# Patient Record
Sex: Male | Born: 1985
Health system: Southern US, Community
[De-identification: ages and names within clinical notes are randomized; demographics above are authoritative.]

## PROBLEM LIST (undated history)

## (undated) DIAGNOSIS — K219 Gastro-esophageal reflux disease without esophagitis: Secondary | ICD-10-CM

## (undated) HISTORY — PX: NO PAST SURGERIES: SHX2092

---
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
## 2007-07-02 ENCOUNTER — Ambulatory Visit: Payer: Self-pay | Admitting: Internal Medicine

## 2010-05-24 ENCOUNTER — Ambulatory Visit: Payer: Self-pay | Admitting: Internal Medicine

## 2010-07-16 ENCOUNTER — Ambulatory Visit: Payer: Self-pay | Admitting: Gastroenterology

## 2010-07-19 LAB — PATHOLOGY REPORT

## 2015-01-02 LAB — BASIC METABOLIC PANEL
BUN: 11 mg/dL (ref 4–21)
CREATININE: 1.1 mg/dL (ref 0.6–1.3)
GLUCOSE: 93 mg/dL
Potassium: 4.7 mmol/L (ref 3.4–5.3)
Sodium: 138 mmol/L (ref 137–147)

## 2015-01-02 LAB — LIPID PANEL
Cholesterol: 146 mg/dL (ref 0–200)
HDL: 58 mg/dL (ref 35–70)
LDL CALC: 77 mg/dL
LDl/HDL Ratio: 1.3
Triglycerides: 57 mg/dL (ref 40–160)

## 2015-01-02 LAB — CBC AND DIFFERENTIAL
HCT: 47 % (ref 41–53)
Hemoglobin: 15.5 g/dL (ref 13.5–17.5)
NEUTROS ABS: 5 /uL
PLATELETS: 245 10*3/uL (ref 150–399)
WBC: 7.4 10*3/mL

## 2015-01-02 LAB — TSH: TSH: 1.04 u[IU]/mL (ref 0.41–5.90)

## 2015-01-02 LAB — HEPATIC FUNCTION PANEL
ALK PHOS: 70 U/L (ref 25–125)
ALT: 23 U/L (ref 10–40)
AST: 20 U/L (ref 14–40)
BILIRUBIN, TOTAL: 0.6 mg/dL

## 2015-10-22 ENCOUNTER — Encounter: Payer: Self-pay | Admitting: Family Medicine

## 2015-10-22 ENCOUNTER — Ambulatory Visit (INDEPENDENT_AMBULATORY_CARE_PROVIDER_SITE_OTHER): Payer: BLUE CROSS/BLUE SHIELD | Admitting: Family Medicine

## 2015-10-22 VITALS — BP 124/78 | HR 90 | Temp 98.0°F | Resp 16 | Wt 202.0 lb

## 2015-10-22 DIAGNOSIS — M79604 Pain in right leg: Secondary | ICD-10-CM

## 2015-10-22 DIAGNOSIS — K219 Gastro-esophageal reflux disease without esophagitis: Secondary | ICD-10-CM | POA: Insufficient documentation

## 2015-10-22 DIAGNOSIS — M7121 Synovial cyst of popliteal space [Baker], right knee: Secondary | ICD-10-CM | POA: Diagnosis not present

## 2015-10-22 DIAGNOSIS — M79641 Pain in right hand: Secondary | ICD-10-CM | POA: Diagnosis not present

## 2015-10-22 MED ORDER — NAPROXEN 500 MG PO TABS: 500.0000 mg | ORAL_TABLET | Freq: Two times a day (BID) | ORAL | Status: DC

## 2015-10-22 NOTE — Progress Notes (Signed)
Patient ID: Cody Marshall, male   DOB: 1985/10/27, 30 y.o.   MRN: [id number]   Cody Marshall  MRN: [id number] DOB: 1985/10/27  Subjective:  HPI  1. Pain of right hand The patient is a 30 year old male who presents for evaluation of his right hand pain.  He notes that he has been having pain in his right hand, mostly the thumb area, for 2-3 weeks.  There has been no known trauma to the hand and has never had any problem in the past.  2. Pain of right lower extremity The patient has also been having trouble with right leg pain.  He notes that the pain is in the knee area and goes down to the calf.  He noticed some bulging veins behind the knee and he states that when he felt the back of his knee it felt different that the left knee.  He wanted to make sure he did not have any thing like a blood clot.   Patient Active Problem List   Diagnosis Date Noted  . Acid reflux 10/22/2015    No past medical history on file.  Social History   Social History  . Marital Status: Married    Spouse Name: N/A  . Number of Children: N/A  . Years of Education: N/A   Occupational History  . Not on file.   Social History Main Topics  . Smoking status: Never Smoker   . Smokeless tobacco: Not on file  . Alcohol Use: 0.0 oz/week    0 Standard drinks or equivalent per week     Comment: 2 per week  . Drug Use: No  . Sexual Activity: Not on file   Other Topics Concern  . Not on file   Social History Narrative    Outpatient Prescriptions Prior to Visit  Medication Sig Dispense Refill  . omeprazole (PRILOSEC) 40 MG capsule Take by mouth.     No facility-administered medications prior to visit.    No Known Allergies  Review of Systems  Constitutional: Negative for fever, chills and weight loss.  Respiratory: Negative for cough, hemoptysis, sputum production, shortness of breath and wheezing.   Cardiovascular: Negative for chest pain, palpitations, orthopnea and leg swelling.    Musculoskeletal: Positive for myalgias and joint pain. Negative for back pain, falls and neck pain.  Neurological: Negative for dizziness, weakness and headaches.   Objective:  BP 124/78 mmHg  Pulse 90  Temp(Src) 98 F (36.7 C) (Oral)  Resp 16  Wt 202 lb (91.627 kg)  Physical Exam  Constitutional: He is well-developed, well-nourished, and in no distress.  HENT:  Head: Normocephalic and atraumatic.  Cardiovascular: Normal rate and regular rhythm.   Pulmonary/Chest: Effort normal.  Musculoskeletal: Normal range of motion.  Left calf measurement 16 in Right calf measurement 16 in  Neurological: He is alert.  Skin: Skin is warm and dry.  Psychiatric: Mood, memory, affect and judgment normal.    Assessment and Plan :   1. Pain of right hand Possible early arthritis/bursitis.   no x-rays yet in this 30 year old.  2. Pain of right lower extremity   3. Baker's cyst, right His most likely the swelling in the right knee. Offered orthopedic referral at any point in time. - naproxen (NAPROSYN) 500 MG tablet; Take 1 tablet (500 mg total) by mouth 2 (two) times daily with a meal.  Dispense: 30 tablet; Refill: 0  I have done the exam and reviewed the above chart and it is  accurate to the best of my knowledge.  Julieanne Manson MD [facility] 10/22/2015 1:59 PM

## 2015-11-05 ENCOUNTER — Encounter: Payer: Self-pay | Admitting: Family Medicine

## 2016-05-25 ENCOUNTER — Ambulatory Visit
Admission: RE | Admit: 2016-05-25 | Discharge: 2016-05-25 | Disposition: A | Discharge status: Home / Self Care | Payer: BLUE CROSS/BLUE SHIELD | Source: Ambulatory Visit | Attending: Family Medicine | Admitting: Family Medicine

## 2016-05-25 ENCOUNTER — Ambulatory Visit (INDEPENDENT_AMBULATORY_CARE_PROVIDER_SITE_OTHER): Payer: BLUE CROSS/BLUE SHIELD | Admitting: Family Medicine

## 2016-05-25 ENCOUNTER — Encounter: Payer: Self-pay | Admitting: Family Medicine

## 2016-05-25 VITALS — BP 118/72 | HR 84 | Temp 98.7°F | Resp 16 | Wt 190.0 lb

## 2016-05-25 DIAGNOSIS — R079 Chest pain, unspecified: Secondary | ICD-10-CM | POA: Insufficient documentation

## 2016-05-25 MED ORDER — RANITIDINE HCL 150 MG PO CAPS: 150.0000 mg | ORAL_CAPSULE | Freq: Two times a day (BID) | ORAL | 11 refills | Status: DC

## 2016-05-25 MED ORDER — ASPIRIN EC 81 MG PO TBEC: 81.0000 mg | DELAYED_RELEASE_TABLET | Freq: Every day | ORAL | 11 refills | Status: DC

## 2016-05-25 NOTE — Progress Notes (Signed)
Patient: Cody Marshall Male    DOB: [birth year]   30 y.o.   MRN: [id number] Visit Date: 05/25/2016  Today's Provider: Megan Mansichard Nikola Marone Jr, MD   Chief Complaint  Patient presents with  . Chest Pain   Subjective:    Chest Pain   This is a recurrent problem. The current episode started 1 to 4 weeks ago (2 weeks ago; has similar episode in  January 2017. Saw [facility] walk in clinic for the issue at that time.). The onset quality is gradual. The problem occurs intermittently. The problem has been gradually worsening. The pain is present in the substernal region (radiating to bilateral axilla, as well as the epigastric region). The pain is at a severity of 2/10. The pain is mild. The quality of the pain is described as burning and sharp. Pertinent negatives include no abdominal pain, back pain, claudication, cough, diaphoresis, dizziness, exertional chest pressure, fever, headaches, hemoptysis, irregular heartbeat, leg pain, lower extremity edema, malaise/fatigue, nausea, near-syncope, numbness, orthopnea, palpitations, shortness of breath, syncope, vomiting or weakness. The pain is aggravated by nothing.  His family medical history is significant for early MI (father died from MI at 28 YO).       No Known Allergies No outpatient prescriptions have been marked as taking for the 05/25/16 encounter (Office Visit) with Maple Hudsonichard L Rayaan Lorah Jr., MD.    Review of Systems  Constitutional: Negative for diaphoresis, fever and malaise/fatigue.  HENT: Negative.   Eyes: Negative.   Respiratory: Negative for cough, hemoptysis and shortness of breath.   Cardiovascular: Positive for chest pain. Negative for palpitations, orthopnea, claudication, syncope and near-syncope.  Gastrointestinal: Negative for abdominal pain, nausea and vomiting.  Musculoskeletal: Negative.  Negative for back pain.  Allergic/Immunologic: Negative.   Neurological: Negative for dizziness, weakness, numbness and headaches.    Psychiatric/Behavioral: Negative.     Social History  Substance Use Topics  . Smoking status: Never Smoker  . Smokeless tobacco: Never Used  . Alcohol use 0.0 oz/week     Comment: 2 per week   Objective:   BP 118/72 (BP Location: Right Arm, Patient Position: Sitting, Cuff Size: Large)   Pulse 84   Temp 98.7 F (37.1 C) (Oral)   Resp 16   Wt 190 lb (86.2 kg)   SpO2 97%   BMI 26.50 kg/m   Physical Exam  Constitutional: He is oriented to person, place, and time. He appears well-developed and well-nourished.  HENT:  Head: Normocephalic and atraumatic.  Right Ear: External ear normal.  Left Ear: External ear normal.  Nose: Nose normal.  Eyes: Conjunctivae are normal.  Neck: Normal range of motion. Neck supple. No thyromegaly present.  Cardiovascular: Normal rate, regular rhythm and normal heart sounds.   Pulmonary/Chest: Effort normal. No respiratory distress. He exhibits no tenderness.  Abdominal: Soft. He exhibits no distension and no mass. There is no tenderness. There is no rebound and no guarding.  Musculoskeletal: He exhibits no edema.  Chest wall nontender  Lymphadenopathy:    He has no cervical adenopathy.  Neurological: He is alert and oriented to person, place, and time.  Skin: Skin is warm and dry.  Psychiatric: He has a normal mood and affect. His behavior is normal. Judgment and thought content normal.        Assessment & Plan:     1. Chest pain, unspecified chest pain type Unclear etiology. EKG WNL. Start ASA 81 mg po qd and Zantac 150 mg po BID as below.  Order CXR and refer to cardiology.Cardiology referral is due to patient's concern with other recently died of CAD at age 72. Treat for GERD. Return to clinic 3-4 weeks - EKG 12-Lead - aspirin EC 81 MG tablet; Take 1 tablet (81 mg total) by mouth daily.  Dispense: 30 tablet; Refill: 11 - ranitidine (ZANTAC) 150 MG capsule; Take 1 capsule (150 mg total) by mouth 2 (two) times daily.  Dispense: 60 capsule;  Refill: 11 - Ambulatory referral to Cardiology - DG Chest 2 View; Future      I have done the exam and reviewed the above chart and it is accurate to the best of my knowledge.    Dorsey Authement Wendelyn Breslow, MD  [facility]

## 2016-05-26 NOTE — Progress Notes (Signed)
Advised  ED 

## 2016-06-03 DIAGNOSIS — R079 Chest pain, unspecified: Secondary | ICD-10-CM | POA: Insufficient documentation

## 2016-06-03 DIAGNOSIS — I208 Other forms of angina pectoris: Secondary | ICD-10-CM | POA: Diagnosis not present

## 2016-06-28 ENCOUNTER — Ambulatory Visit: Payer: BLUE CROSS/BLUE SHIELD | Admitting: Family Medicine

## 2016-07-04 ENCOUNTER — Ambulatory Visit: Payer: Self-pay | Admitting: Family Medicine

## 2016-07-06 ENCOUNTER — Ambulatory Visit: Payer: Self-pay | Admitting: Family Medicine

## 2016-08-03 ENCOUNTER — Ambulatory Visit (INDEPENDENT_AMBULATORY_CARE_PROVIDER_SITE_OTHER): Payer: BLUE CROSS/BLUE SHIELD | Admitting: Family Medicine

## 2016-08-03 VITALS — BP 110/72 | HR 76 | Temp 98.3°F | Resp 16 | Ht 71.0 in | Wt 193.0 lb

## 2016-08-03 DIAGNOSIS — Z0000 Encounter for general adult medical examination without abnormal findings: Secondary | ICD-10-CM

## 2016-08-03 NOTE — Progress Notes (Signed)
Patient: Cody Marshall, Male    DOB: 03/02/1986, 30 y.o.   MRN: [id number] Visit Date: 08/03/2016  Today's Provider: Wilhemena Durie, MD   Chief Complaint  Patient presents with  . Annual Exam   Subjective:  Cody Marshall is a 30 y.o. male who presents today for health maintenance and complete physical. He feels well. He reports exercising daily. He reports he is sleeping well.   Immunization History  Administered Date(s) Administered  . DTaP 01/14/1988, 03/16/1988, 05/13/1988, 02/10/1989, 12/09/1992  . Hepatitis B 08/02/1999, 09/27/1999, 02/14/2000  . HiB (PRP-OMP) 05/10/1989  . IPV 01/14/1988, 03/16/1988, 02/10/1989, 12/09/1992  . MMR 02/10/1989, 12/09/1992  . Tdap 12/31/2014     Review of Systems  Constitutional: Negative.   HENT: Negative.   Eyes: Negative.   Respiratory: Negative.   Cardiovascular: Negative.   Gastrointestinal: Negative.   Endocrine: Negative.   Genitourinary: Negative.   Musculoskeletal: Negative.   Skin: Negative.   Allergic/Immunologic: Negative.   Neurological: Negative.   Hematological: Negative.   Psychiatric/Behavioral: Negative.     Social History   Social History  . Marital status: Married    Spouse name: N/A  . Number of children: N/A  . Years of education: N/A   Occupational History  . Not on file.   Social History Main Topics  . Smoking status: Never Smoker  . Smokeless tobacco: Never Used  . Alcohol use 0.0 oz/week     Comment: 2 per week  . Drug use: No  . Sexual activity: Not on file   Other Topics Concern  . Not on file   Social History Narrative  . No narrative on file    Patient Active Problem List   Diagnosis Date Noted  . Acid reflux 10/22/2015    Past Surgical History:  Procedure Laterality Date  . NO PAST SURGERIES      His family history includes Heart disease in his maternal grandfather.    Outpatient Encounter Prescriptions as of 08/03/2016  Medication Sig  . omeprazole (PRILOSEC) 20 MG  capsule Take 20 mg by mouth daily.  . [DISCONTINUED] aspirin EC 81 MG tablet Take 1 tablet (81 mg total) by mouth daily.  . [DISCONTINUED] ranitidine (ZANTAC) 150 MG capsule Take 1 capsule (150 mg total) by mouth 2 (two) times daily.   No facility-administered encounter medications on file as of 08/03/2016.     Patient Care Team: Jerrol Banana., MD as PCP - General (Family Medicine)     Objective:   Vitals:  Vitals:   08/03/16 1345  BP: 110/72  Pulse: 76  Resp: 16  Temp: 98.3 F (36.8 C)  TempSrc: Oral  Weight: 193 lb (87.5 kg)  Height: 5' 11"  (1.803 m)    Physical Exam  Constitutional: He is oriented to person, place, and time. He appears well-developed and well-nourished.  HENT:  Head: Normocephalic and atraumatic.  Right Ear: External ear normal.  Left Ear: External ear normal.  Nose: Nose normal.  Mouth/Throat: Oropharynx is clear and moist.  Eyes: Conjunctivae and EOM are normal. Pupils are equal, round, and reactive to light.  Neck: Normal range of motion. Neck supple.  Cardiovascular: Normal rate, regular rhythm, normal heart sounds and intact distal pulses.   Pulmonary/Chest: Effort normal and breath sounds normal.  Abdominal: Soft. Bowel sounds are normal.  Genitourinary: Penis normal.  Musculoskeletal: Normal range of motion.  Neurological: He is alert and oriented to person, place, and time.  Skin: Skin is warm and dry.  Psychiatric: He has a normal mood and affect. His behavior is normal. Judgment and thought content normal.     Depression Screen No flowsheet data found.    Assessment & Plan:     Routine Health Maintenance and Physical Exam  Exercise Activities and Dietary recommendations Goals    None      Immunization History  Administered Date(s) Administered  . DTaP 01/14/1988, 03/16/1988, 05/13/1988, 02/10/1989, 12/09/1992  . Hepatitis B 08/02/1999, 09/27/1999, 02/14/2000  . HiB (PRP-OMP) 05/10/1989  . IPV 01/14/1988,  03/16/1988, 02/10/1989, 12/09/1992  . MMR 02/10/1989, 12/09/1992  . Tdap 12/31/2014    Health Maintenance  Topic Date Due  . HIV Screening  11/10/2002  . INFLUENZA VACCINE  10/21/2016 (Originally 05/17/2016)  . TETANUS/TDAP  12/30/2024      Discussed health benefits of physical activity, and encouraged him to engage in regular exercise appropriate for his age and condition.    I have done the exam and reviewed the chart and it is accurate to the best of my knowledge. Miguel Aschoff M.D. [facility]

## 2016-08-09 DIAGNOSIS — Z0000 Encounter for general adult medical examination without abnormal findings: Secondary | ICD-10-CM | POA: Diagnosis not present

## 2016-08-10 LAB — CBC WITH DIFFERENTIAL/PLATELET
Basophils Absolute: 0 10*3/uL (ref 0.0–0.2)
Basos: 0 %
EOS (ABSOLUTE): 0.1 10*3/uL (ref 0.0–0.4)
Eos: 1 %
HEMOGLOBIN: 15.1 g/dL (ref 12.6–17.7)
Hematocrit: 45.1 % (ref 37.5–51.0)
IMMATURE GRANS (ABS): 0 10*3/uL (ref 0.0–0.1)
IMMATURE GRANULOCYTES: 0 %
LYMPHS: 36 %
Lymphocytes Absolute: 1.8 10*3/uL (ref 0.7–3.1)
MCH: 30.6 pg (ref 26.6–33.0)
MCHC: 33.5 g/dL (ref 31.5–35.7)
MCV: 92 fL (ref 79–97)
MONOCYTES: 9 %
Monocytes Absolute: 0.5 10*3/uL (ref 0.1–0.9)
NEUTROS PCT: 54 %
Neutrophils Absolute: 2.6 10*3/uL (ref 1.4–7.0)
PLATELETS: 219 10*3/uL (ref 150–379)
RBC: 4.93 x10E6/uL (ref 4.14–5.80)
RDW: 13.5 % (ref 12.3–15.4)
WBC: 4.9 10*3/uL (ref 3.4–10.8)

## 2016-08-10 LAB — COMPREHENSIVE METABOLIC PANEL
A/G RATIO: 1.5 (ref 1.2–2.2)
ALBUMIN: 4.3 g/dL (ref 3.5–5.5)
ALT: 31 IU/L (ref 0–44)
AST: 20 IU/L (ref 0–40)
Alkaline Phosphatase: 71 IU/L (ref 39–117)
BUN / CREAT RATIO: 9 (ref 9–20)
BUN: 9 mg/dL (ref 6–20)
Bilirubin Total: 0.4 mg/dL (ref 0.0–1.2)
CALCIUM: 9.6 mg/dL (ref 8.7–10.2)
CO2: 26 mmol/L (ref 18–29)
Chloride: 100 mmol/L (ref 96–106)
Creatinine, Ser: 1 mg/dL (ref 0.76–1.27)
GFR, EST AFRICAN AMERICAN: 118 mL/min/{1.73_m2} (ref >59)
GFR, EST NON AFRICAN AMERICAN: 102 mL/min/{1.73_m2} (ref >59)
Globulin, Total: 2.8 g/dL (ref 1.5–4.5)
Glucose: 94 mg/dL (ref 65–99)
Potassium: 4.7 mmol/L (ref 3.5–5.2)
Sodium: 140 mmol/L (ref 134–144)
TOTAL PROTEIN: 7.1 g/dL (ref 6.0–8.5)

## 2016-08-10 LAB — LIPID PANEL WITH LDL/HDL RATIO
Cholesterol, Total: 166 mg/dL (ref 100–199)
HDL: 52 mg/dL (ref >39)
LDL CALC: 94 mg/dL (ref 0–99)
LDL/HDL RATIO: 1.8 ratio (ref 0.0–3.6)
Triglycerides: 102 mg/dL (ref 0–149)
VLDL CHOLESTEROL CAL: 20 mg/dL (ref 5–40)

## 2016-08-10 LAB — TSH: TSH: 1.31 u[IU]/mL (ref 0.450–4.500)

## 2016-08-16 ENCOUNTER — Encounter: Payer: Self-pay | Admitting: Family Medicine

## 2017-02-13 IMAGING — CR DG CHEST 2V
1 series · 2 of 2 positions shown · non-contrast
Comparison: None.

CLINICAL DATA: Chest pain for 2 month

EXAM:
CHEST  2 VIEW

[Series 1: dg chest 2 view · 0.14mm/px · 2 of 2 slices shown]
[im 1/2]
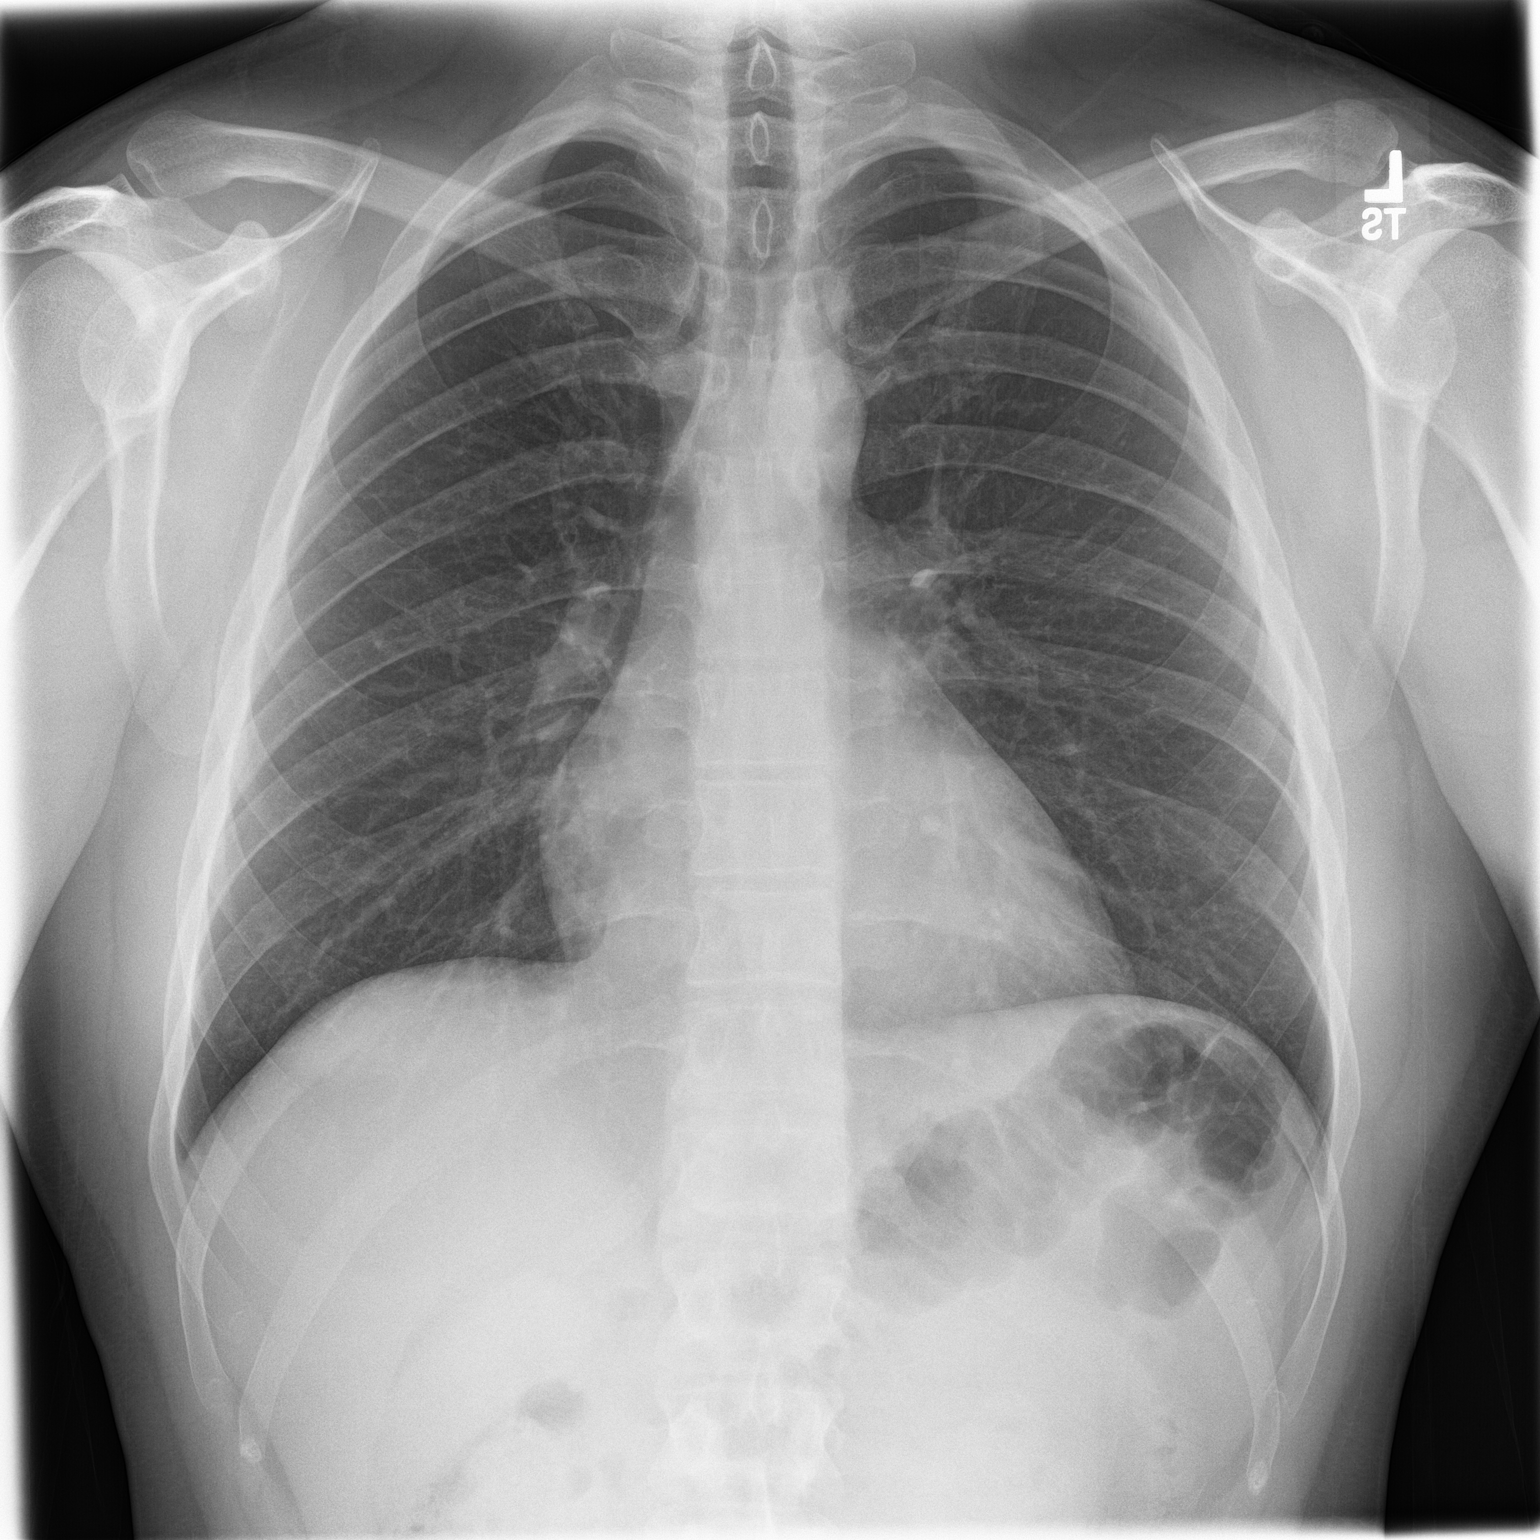
[im 2/2]
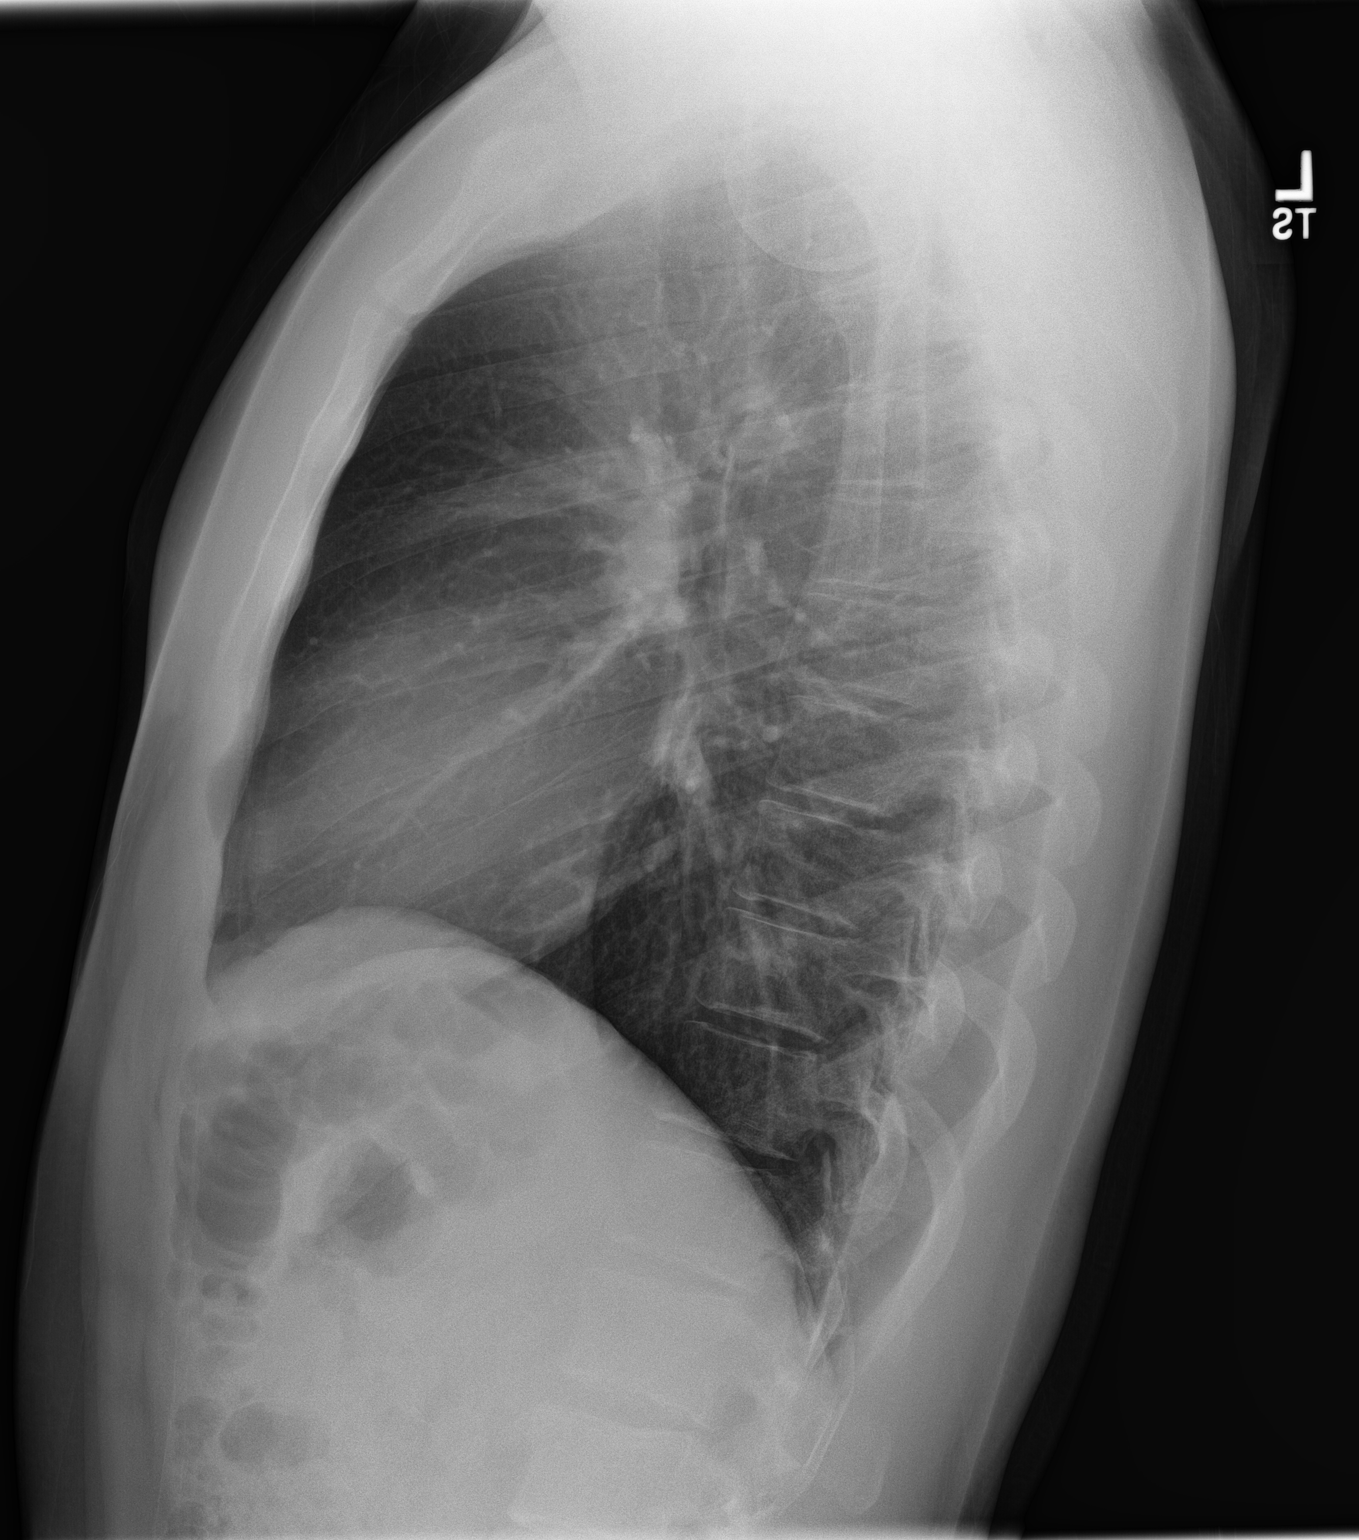

[2 of 2 positions shown; findings below may reference images not displayed]

FINDINGS: Normal heart size. Lungs clear. No pneumothorax. No pleural
effusion.
IMPRESSION: No active cardiopulmonary disease.

## 2017-05-17 ENCOUNTER — Ambulatory Visit (INDEPENDENT_AMBULATORY_CARE_PROVIDER_SITE_OTHER): Payer: BLUE CROSS/BLUE SHIELD | Admitting: Family Medicine

## 2017-05-17 VITALS — BP 122/80 | HR 64 | Temp 98.0°F | Resp 16 | Ht 71.0 in | Wt 200.0 lb

## 2017-05-17 DIAGNOSIS — M7521 Bicipital tendinitis, right shoulder: Secondary | ICD-10-CM

## 2017-05-17 MED ORDER — IBUPROFEN 600 MG PO TABS: 600.0000 mg | ORAL_TABLET | Freq: Three times a day (TID) | ORAL | 0 refills | Status: DC | PRN

## 2017-05-17 NOTE — Patient Instructions (Signed)
Immunization History  Administered Date(s) Administered  . DTaP 01/14/1988, 03/16/1988, 05/13/1988, 02/10/1989, 12/09/1992  . Hepatitis B 08/02/1999, 09/27/1999, 02/14/2000  . HiB (PRP-OMP) 05/10/1989  . IPV 01/14/1988, 03/16/1988, 02/10/1989, 12/09/1992  . MMR 02/10/1989, 12/09/1992  . Tdap 12/31/2014

## 2017-05-17 NOTE — Progress Notes (Signed)
Cody Marshall  MRN: [id number] DOB: [birth year]  Subjective:  HPI   The patient is a 31 year old male who presents for evaluation of right arm/bicep/shoulder pain.  The patient has had no known trauma to the area and has no neck pain.   He reprts that 3 weeks ago while sitting at work he noticed a sudden sharp pain in the bicep area.  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  Since that time the patient has notic ed the pain to be more of a burning sensation that radiate upward and into  His shoulder.  He states that when he massages the posterior portion of the upper arm he gets some relief in the bicep area.  Certain morvement makes the pain worse.  The patient feels that because he is using his arm differently that is what caused the pain in the shoulder.  During the time that only the bicep was hurting he was able to lift the arm but not extend the arm when in the lifted position.  At one point the area would hurt with almost any movement.  Today the pain is not quite as bad.  Ibuprofen has been helping the pain.  Patient Active Problem List   Diagnosis Date Noted  . Acid reflux 10/22/2015    No past medical history on file.  Social History   Social History  . Marital status: Married    Spouse name: N/A  . Number of children: N/A  . Years of education: N/A   Occupational History  . Not on file.   Social History Main Topics  . Smoking status: Never Smoker  . Smokeless tobacco: Never Used  . Alcohol use 0.0 oz/week     Comment: 2 per week  . Drug use: No  . Sexual activity: Not on file   Other Topics Concern  . Not on file   Social History Narrative  . No narrative on file    Outpatient Encounter Prescriptions as of 05/17/2017  Medication Sig  . omeprazole (PRILOSEC) 20 MG capsule Take 20 mg by mouth daily.   No facility-administered encounter medications on file as of 05/17/2017.     No  Known Allergies  Review of Systems  Constitutional: Negative for fever.  Respiratory: Negative for cough, shortness of breath and wheezing.   Cardiovascular: Negative for chest pain, palpitations and orthopnea.  Musculoskeletal: Positive for joint pain and myalgias. Negative for back pain and neck pain.       Range of motion and strength has been limited due to pain, both appear better today.  Neurological: Negative for tingling, tremors, sensory change and focal weakness.    Objective:  BP 122/80 (BP Location: Right Arm, Patient Position: Sitting, Cuff Size: Normal)   Pulse 64   Temp 98 F (36.7 C) (Oral)   Resp 16   Ht 5\' 11"  (1.803 m)   Wt 200 lb (90.7 kg)   BMI 27.89 kg/m   Physical Exam  Constitutional: He is well-developed, well-nourished, and in no distress.  HENT:  Head: Normocephalic and atraumatic.  Eyes: Pupils are equal, round, and reactive to light.  Neck: Normal range of motion.  Cardiovascular: Normal rate, regular rhythm, normal heart sounds and intact distal pulses.   Pulmonary/Chest: Effort normal and breath sounds normal.  Musculoskeletal: Normal range of motion. He exhibits tenderness (biceps tendon).    Assessment  and Plan :   1. Biceps tendinitis of right upper extremity Utilize Ibuprofen for 2 weeks.  If pain is persistent may need referral to Ortho.  - ibuprofen (ADVIL,MOTRIN) 600 MG tablet; Take 1 tablet (600 mg total) by mouth every 8 (eight) hours as needed.  Dispense: 30 tablet; Refill: 0  HPI, Exam and A&P Transcribed under the direction and in the presence of Cody Marshall, Jr., MD. Electronically Signed: Janey GreaserElena Marshall, Cody Marshall have done the exam and reviewed the chart and it is accurate to the best of my knowledge. [employer]ist[employer]  technology has been used and  any errors in dictation or transcription are unintentional. Cody Marshall M.D. [facility]

## 2018-01-11 ENCOUNTER — Ambulatory Visit: Payer: BLUE CROSS/BLUE SHIELD | Admitting: Family Medicine

## 2018-01-11 ENCOUNTER — Encounter: Payer: Self-pay | Admitting: Family Medicine

## 2018-01-11 VITALS — BP 130/78 | HR 63 | Temp 98.2°F | Wt 207.8 lb

## 2018-01-11 DIAGNOSIS — L2389 Allergic contact dermatitis due to other agents: Secondary | ICD-10-CM

## 2018-01-11 MED ORDER — BETAMETHASONE DIPROPIONATE 0.05 % EX CREA: TOPICAL_CREAM | Freq: Two times a day (BID) | CUTANEOUS | 0 refills | Status: DC

## 2018-01-11 NOTE — Patient Instructions (Signed)

## 2018-01-11 NOTE — Progress Notes (Signed)
       Patient: Cody RodCasey L Kittel Male    DOB: [birth year]   32 y.o.   MRN: [id number] Visit Date: 01/11/2018  Today's Provider: Dortha Kernennis Beatryce Colombo, PA   Chief Complaint  Patient presents with  . Rash   Subjective:    Rash  This is a new problem. Episode onset: Sunday. The affected locations include the right hand. The rash is characterized by blistering, itchiness and redness. He was exposed to plant contact. Treatments tried: Hydrocortisone cream. The treatment provided no relief.   No past medical history on file. Patient Active Problem List   Diagnosis Date Noted  . Chest pain, unspecified 06/03/2016  . Acid reflux 10/22/2015   Past Surgical History:  Procedure Laterality Date  . NO PAST SURGERIES     Family History  Problem Relation Age of Onset  . Heart disease Maternal Grandfather    No Known Allergies  Current Outpatient Medications:  .  ibuprofen (ADVIL,MOTRIN) 600 MG tablet, Take 1 tablet (600 mg total) by mouth every 8 (eight) hours as needed., Disp: 30 tablet, Rfl: 0 .  omeprazole (PRILOSEC) 20 MG capsule, Take 20 mg by mouth daily., Disp: , Rfl:   Review of Systems  Constitutional: Negative.   Respiratory: Negative.   Cardiovascular: Negative.   Skin: Positive for rash.   Social History   Tobacco Use  . Smoking status: Never Smoker  . Smokeless tobacco: Never Used  Substance Use Topics  . Alcohol use: Yes    Alcohol/week: 0.0 oz    Comment: 2 per week   Objective:   BP 130/78 (BP Location: Right Arm, Patient Position: Sitting, Cuff Size: Normal)   Pulse 63   Temp 98.2 F (36.8 C) (Oral)   Wt 207 lb 12.8 oz (94.3 kg)   SpO2 99%   BMI 28.98 kg/m   Physical Exam  Constitutional: He is oriented to person, place, and time. He appears well-developed and well-nourished. No distress.  HENT:  Head: Normocephalic and atraumatic.  Right Ear: Hearing normal.  Left Ear: Hearing normal.  Nose: Nose normal.  Eyes: Conjunctivae and lids are normal. Right eye  exhibits no discharge. Left eye exhibits no discharge. No scleral icterus.  Pulmonary/Chest: Effort normal. No respiratory distress.  Musculoskeletal: Normal range of motion.  Neurological: He is alert and oriented to person, place, and time.  Skin: Skin is intact. Rash noted. No lesion noted.  Pruritic vesicular rash on dorsum of the right hand with some rash between fingers.  Psychiatric: He has a normal mood and affect. His speech is normal and behavior is normal. Thought content normal.      Assessment & Plan:     1. Allergic contact dermatitis due to other agents Onset 4 days ago after cutting wood. Very itchy and blistery red rash on the back of the right hand and the webs between fingers. Treat with Diprolene cream BID and Domeboro soaks. Recheck in 5 days if needed. - betamethasone dipropionate (DIPROLENE) 0.05 % cream; Apply topically 2 (two) times daily.  Dispense: 30 g; Refill: 0       Dortha Kernennis Sumeet Geter, PA  [facility]

## 2018-01-18 ENCOUNTER — Ambulatory Visit (INDEPENDENT_AMBULATORY_CARE_PROVIDER_SITE_OTHER): Payer: BLUE CROSS/BLUE SHIELD | Admitting: Family Medicine

## 2018-01-18 VITALS — BP 118/76 | HR 60 | Temp 98.2°F | Resp 16 | Ht 71.0 in | Wt 206.0 lb

## 2018-01-18 DIAGNOSIS — Z0000 Encounter for general adult medical examination without abnormal findings: Secondary | ICD-10-CM

## 2018-01-18 LAB — POCT URINALYSIS DIPSTICK
Bilirubin, UA: NEGATIVE
GLUCOSE UA: NEGATIVE
KETONES UA: NEGATIVE
LEUKOCYTES UA: NEGATIVE
NITRITE UA: NEGATIVE
RBC UA: NEGATIVE
Urobilinogen, UA: 0.2 E.U./dL
pH, UA: 8.5 — AB (ref 5.0–8.0)

## 2018-01-18 NOTE — Progress Notes (Signed)
Patient: Cody Marshall, Male    DOB: 1985/12/14, 32 y.o.   MRN: [id number] Visit Date: 01/18/2018  Today's Provider: Wilhemena Durie, MD   Chief Complaint  Patient presents with  . Annual Exam   Subjective:  Cody Marshall is a 32 y.o. male who presents today for health maintenance and complete physical. He feels well. He reports exercising DAILY. He reports he is sleeping well.  Review of Systems  Constitutional: Negative.   HENT: Negative.   Eyes: Negative.   Respiratory: Negative.   Cardiovascular: Negative.   Gastrointestinal: Negative.   Endocrine: Negative.   Genitourinary: Negative.   Musculoskeletal: Negative.   Skin: Negative.   Allergic/Immunologic: Negative.   Neurological: Negative.   Hematological: Negative.   Psychiatric/Behavioral: Negative.     Social History   Socioeconomic History  . Marital status: Married    Spouse name: Not on file  . Number of children: Not on file  . Years of education: Not on file  . Highest education level: Not on file  Occupational History  . Not on file  Social Needs  . Financial resource strain: Not on file  . Food insecurity:    Worry: Not on file    Inability: Not on file  . Transportation needs:    Medical: Not on file    Non-medical: Not on file  Tobacco Use  . Smoking status: Never Smoker  . Smokeless tobacco: Never Used  Substance and Sexual Activity  . Alcohol use: Yes    Alcohol/week: 0.0 oz    Comment: 2 per week  . Drug use: No  . Sexual activity: Not on file  Lifestyle  . Physical activity:    Days per week: Not on file    Minutes per session: Not on file  . Stress: Not on file  Relationships  . Social connections:    Talks on phone: Not on file    Gets together: Not on file    Attends religious service: Not on file    Active member of club or organization: Not on file    Attends meetings of clubs or organizations: Not on file    Relationship status: Not on file  . Intimate partner violence:     Fear of current or ex partner: Not on file    Emotionally abused: Not on file    Physically abused: Not on file    Forced sexual activity: Not on file  Other Topics Concern  . Not on file  Social History Narrative  . Not on file    Patient Active Problem List   Diagnosis Date Noted  . Chest pain, unspecified 06/03/2016  . Acid reflux 10/22/2015    Past Surgical History:  Procedure Laterality Date  . NO PAST SURGERIES      His family history includes Heart disease in his maternal grandfather.     Outpatient Encounter Medications as of 01/18/2018  Medication Sig  . betamethasone dipropionate (DIPROLENE) 0.05 % cream Apply topically 2 (two) times daily.  Marland Kitchen ibuprofen (ADVIL,MOTRIN) 600 MG tablet Take 1 tablet (600 mg total) by mouth every 8 (eight) hours as needed.  Marland Kitchen omeprazole (PRILOSEC) 20 MG capsule Take 20 mg by mouth daily.   No facility-administered encounter medications on file as of 01/18/2018.     Patient Care Team: Jerrol Banana., MD as PCP - General (Family Medicine)      Objective:   Vitals:  Vitals:   01/18/18 0902  BP: 118/76  Pulse: 60  Resp: 16  Temp: 98.2 F (36.8 C)  TempSrc: Oral  Weight: 206 lb (93.4 kg)  Height: _0  (1.803 m)    Physical Exam  Constitutional: He is oriented to person, place, and time. He appears well-developed and well-nourished.  HENT:  Head: Normocephalic and atraumatic.  Right Ear: External ear normal.  Left Ear: External ear normal.  Nose: Nose normal.  Mouth/Throat: Oropharynx is clear and moist.  Rt tonsil 2+. Lt tonsil 1+.  Eyes: Pupils are equal, round, and reactive to light. Conjunctivae and EOM are normal.  Neck: Normal range of motion. Neck supple.  Cardiovascular: Normal rate, regular rhythm, normal heart sounds and intact distal pulses.  Pulmonary/Chest: Effort normal and breath sounds normal.  Abdominal: Soft. Bowel sounds are normal.  Genitourinary: Penis normal.  Musculoskeletal: Normal  range of motion.  Neurological: He is alert and oriented to person, place, and time.  Skin: Skin is warm and dry.  Psychiatric: He has a normal mood and affect. His behavior is normal. Judgment and thought content normal.   Fall Risk  01/18/2018  Falls in the past year? No   Home Exercise  01/18/2018  Current Exercise Habits Home exercise routine  Frequency (Times/Week) 7   Functional Status Survey: Is the patient deaf or have difficulty hearing?: No Does the patient have difficulty seeing, even when wearing glasses/contacts?: No Does the patient have difficulty concentrating, remembering, or making decisions?: No Does the patient have difficulty walking or climbing stairs?: No Does the patient have difficulty dressing or bathing?: No Does the patient have difficulty doing errands alone such as visiting a doctor's office or shopping?: No  Depression Screen PHQ 2/9 Scores 01/18/2018 05/17/2017  PHQ - 2 Score 0 0      Assessment & Plan:     Routine Health Maintenance and Physical Exam  Exercise Activities and Dietary recommendations Goals    None      Immunization History  Administered Date(s) Administered  . DTaP 01/14/1988, 03/16/1988, 05/13/1988, 02/10/1989, 12/09/1992  . Hepatitis B 08/02/1999, 09/27/1999, 02/14/2000  . HiB (PRP-OMP) 05/10/1989  . IPV 01/14/1988, 03/16/1988, 02/10/1989, 12/09/1992  . MMR 02/10/1989, 12/09/1992  . Tdap 12/31/2014    Health Maintenance  Topic Date Due  . HIV Screening  11/10/2002  . INFLUENZA VACCINE  06/16/2018 (Originally 05/17/2018)  . TETANUS/TDAP  12/30/2024     Discussed health benefits of physical activity, and encouraged him to engage in regular exercise appropriate for his age and condition.    I have done the exam and reviewed the chart and it is accurate to the best of my knowledge. Development worker, community has been used and  any errors in dictation or transcription are unintentional. Miguel Aschoff M.D. [facility]

## 2018-01-19 LAB — CBC WITH DIFFERENTIAL/PLATELET
BASOS ABS: 0 10*3/uL (ref 0.0–0.2)
Basos: 0 %
EOS (ABSOLUTE): 0 10*3/uL (ref 0.0–0.4)
Eos: 1 %
Hematocrit: 45.7 % (ref 37.5–51.0)
Hemoglobin: 15.5 g/dL (ref 13.0–17.7)
IMMATURE GRANS (ABS): 0 10*3/uL (ref 0.0–0.1)
Immature Granulocytes: 0 %
LYMPHS ABS: 2 10*3/uL (ref 0.7–3.1)
LYMPHS: 39 %
MCH: 30.6 pg (ref 26.6–33.0)
MCHC: 33.9 g/dL (ref 31.5–35.7)
MCV: 90 fL (ref 79–97)
Monocytes Absolute: 0.5 10*3/uL (ref 0.1–0.9)
Monocytes: 9 %
Neutrophils Absolute: 2.5 10*3/uL (ref 1.4–7.0)
Neutrophils: 51 %
Platelets: 253 10*3/uL (ref 150–379)
RBC: 5.06 x10E6/uL (ref 4.14–5.80)
RDW: 13.8 % (ref 12.3–15.4)
WBC: 5 10*3/uL (ref 3.4–10.8)

## 2018-01-19 LAB — COMPREHENSIVE METABOLIC PANEL
ALK PHOS: 73 IU/L (ref 39–117)
ALT: 32 IU/L (ref 0–44)
AST: 19 IU/L (ref 0–40)
Albumin/Globulin Ratio: 1.8 (ref 1.2–2.2)
Albumin: 4.6 g/dL (ref 3.5–5.5)
BUN / CREAT RATIO: 14 (ref 9–20)
BUN: 14 mg/dL (ref 6–20)
Bilirubin Total: 0.7 mg/dL (ref 0.0–1.2)
CO2: 25 mmol/L (ref 20–29)
CREATININE: 1.01 mg/dL (ref 0.76–1.27)
Calcium: 9.8 mg/dL (ref 8.7–10.2)
Chloride: 100 mmol/L (ref 96–106)
GFR calc Af Amer: 115 mL/min/{1.73_m2} (ref >59)
GFR calc non Af Amer: 99 mL/min/{1.73_m2} (ref >59)
GLUCOSE: 95 mg/dL (ref 65–99)
Globulin, Total: 2.6 g/dL (ref 1.5–4.5)
Potassium: 4.4 mmol/L (ref 3.5–5.2)
Sodium: 139 mmol/L (ref 134–144)
Total Protein: 7.2 g/dL (ref 6.0–8.5)

## 2018-01-19 LAB — LIPID PANEL WITH LDL/HDL RATIO
CHOLESTEROL TOTAL: 185 mg/dL (ref 100–199)
HDL: 52 mg/dL (ref >39)
LDL CALC: 111 mg/dL — AB (ref 0–99)
LDl/HDL Ratio: 2.1 ratio (ref 0.0–3.6)
TRIGLYCERIDES: 108 mg/dL (ref 0–149)
VLDL CHOLESTEROL CAL: 22 mg/dL (ref 5–40)

## 2018-01-19 LAB — TSH: TSH: 1.56 u[IU]/mL (ref 0.450–4.500)

## 2018-01-23 ENCOUNTER — Telehealth: Payer: Self-pay

## 2018-01-23 NOTE — Telephone Encounter (Signed)
-----   Message from Maple Hudsonichard L Gilbert Jr., MD sent at 01/22/2018  1:05 PM EDT ----- Labs OK.

## 2018-01-23 NOTE — Telephone Encounter (Signed)
Patient advised as directed below.  Thanks,  -Joseline 

## 2018-10-03 DIAGNOSIS — S9031XA Contusion of right foot, initial encounter: Secondary | ICD-10-CM | POA: Diagnosis not present

## 2018-10-03 DIAGNOSIS — M79671 Pain in right foot: Secondary | ICD-10-CM | POA: Diagnosis not present

## 2018-12-25 DIAGNOSIS — M722 Plantar fascial fibromatosis: Secondary | ICD-10-CM | POA: Diagnosis not present

## 2019-10-29 DIAGNOSIS — Z20822 Contact with and (suspected) exposure to covid-19: Secondary | ICD-10-CM | POA: Diagnosis not present

## 2020-04-15 ENCOUNTER — Emergency Department: Payer: BLUE CROSS/BLUE SHIELD

## 2020-04-15 ENCOUNTER — Other Ambulatory Visit: Payer: Self-pay

## 2020-04-15 ENCOUNTER — Emergency Department
Admission: EM | Admit: 2020-04-15 | Discharge: 2020-04-15 | Disposition: A | Discharge status: Home / Self Care | Payer: BLUE CROSS/BLUE SHIELD | Attending: Emergency Medicine | Admitting: Emergency Medicine

## 2020-04-15 ENCOUNTER — Encounter: Payer: Self-pay | Admitting: Emergency Medicine

## 2020-04-15 DIAGNOSIS — K2100 Gastro-esophageal reflux disease with esophagitis, without bleeding: Secondary | ICD-10-CM | POA: Insufficient documentation

## 2020-04-15 DIAGNOSIS — R0789 Other chest pain: Secondary | ICD-10-CM | POA: Diagnosis not present

## 2020-04-15 DIAGNOSIS — R079 Chest pain, unspecified: Secondary | ICD-10-CM | POA: Diagnosis not present

## 2020-04-15 LAB — CBC WITH DIFFERENTIAL/PLATELET
Abs Immature Granulocytes: 0.01 10*3/uL (ref 0.00–0.07)
Basophils Absolute: 0 10*3/uL (ref 0.0–0.1)
Basophils Relative: 0 %
Eosinophils Absolute: 0.1 10*3/uL (ref 0.0–0.5)
Eosinophils Relative: 1 %
HCT: 44.7 % (ref 39.0–52.0)
Hemoglobin: 15.6 g/dL (ref 13.0–17.0)
Immature Granulocytes: 0 %
Lymphocytes Relative: 34 %
Lymphs Abs: 1.6 10*3/uL (ref 0.7–4.0)
MCH: 31 pg (ref 26.0–34.0)
MCHC: 34.9 g/dL (ref 30.0–36.0)
MCV: 88.9 fL (ref 80.0–100.0)
Monocytes Absolute: 0.5 10*3/uL (ref 0.1–1.0)
Monocytes Relative: 10 %
Neutro Abs: 2.7 10*3/uL (ref 1.7–7.7)
Neutrophils Relative %: 55 %
Platelets: 243 10*3/uL (ref 150–400)
RBC: 5.03 MIL/uL (ref 4.22–5.81)
RDW: 13.5 % (ref 11.5–15.5)
WBC: 4.9 10*3/uL (ref 4.0–10.5)
nRBC: 0 % (ref 0.0–0.2)

## 2020-04-15 LAB — BASIC METABOLIC PANEL
Anion gap: 8 (ref 5–15)
BUN: 15 mg/dL (ref 6–20)
CO2: 28 mmol/L (ref 22–32)
Calcium: 9.4 mg/dL (ref 8.9–10.3)
Chloride: 102 mmol/L (ref 98–111)
Creatinine, Ser: 1.13 mg/dL (ref 0.61–1.24)
GFR calc Af Amer: >60 mL/min (ref >60)
GFR calc non Af Amer: >60 mL/min (ref >60)
Glucose, Bld: 113 mg/dL — ABNORMAL HIGH (ref 70–99)
Potassium: 3.9 mmol/L (ref 3.5–5.1)
Sodium: 138 mmol/L (ref 135–145)

## 2020-04-15 LAB — TROPONIN I (HIGH SENSITIVITY): Troponin I (High Sensitivity): 3 ng/L (ref <18)

## 2020-04-15 MED ORDER — LIDOCAINE VISCOUS HCL 2 % MT SOLN
15.0000 mL | Freq: Once | OROMUCOSAL | Status: AC
  Administered 2020-04-15: 15 mL via ORAL
  Filled 2020-04-15: qty 15

## 2020-04-15 MED ORDER — ALUM & MAG HYDROXIDE-SIMETH 200-200-20 MG/5ML PO SUSP
15.0000 mL | Freq: Once | ORAL | Status: AC
  Administered 2020-04-15: 15 mL via ORAL
  Filled 2020-04-15: qty 30

## 2020-04-15 NOTE — ED Triage Notes (Signed)
Patient reports chest pain "for a while". States last night he also started having nausea and pain in left arm.

## 2020-04-15 NOTE — ED Provider Notes (Signed)
[facility] Emergency Department Provider Note   ____________________________________________   First MD Initiated Contact with Patient 04/15/20 0801     (approximate)  I have reviewed the triage vital signs and the nursing notes.   HISTORY  Chief Complaint Chest Pain    HPI Cody Marshall is a 34 y.o. male with past medical history of GERD who presents to the ED complaining of chest pain.  Patient reports that he has had pain in his chest intermittently for about the past month.  He describes it as a central pressure that can come on at any time and is not associated with exertion.  Pain has become more frequent and constant over the past couple of days, he is now also having some nausea as well as pain moving down his left arm.  He denies any fevers, cough, shortness of breath, leg swelling or pain.  He takes omeprazole as needed for his GERD but this has not alleviated his symptoms.        History reviewed. No pertinent past medical history.  Patient Active Problem List   Diagnosis Date Noted  . Chest pain, unspecified 06/03/2016  . Acid reflux 10/22/2015    Past Surgical History:  Procedure Laterality Date  . NO PAST SURGERIES      Prior to Admission medications   Not on File    Allergies Patient has no known allergies.  Family History  Problem Relation Age of Onset  . Heart disease Maternal Grandfather     Social History Social History   Tobacco Use  . Smoking status: Never Smoker  . Smokeless tobacco: Never Used  Substance Use Topics  . Alcohol use: Yes    Alcohol/week: 0.0 standard drinks    Comment: 2 per week  . Drug use: No    Review of Systems  Constitutional: No fever/chills Eyes: No visual changes. ENT: No sore throat. Cardiovascular: Positive for chest pain. Respiratory: Denies shortness of breath. Gastrointestinal: No abdominal pain.  No nausea, no vomiting.  No diarrhea.  No constipation. Genitourinary:  Negative for dysuria. Musculoskeletal: Negative for back pain. Skin: Negative for rash. Neurological: Negative for headaches, focal weakness or numbness.  ____________________________________________   PHYSICAL EXAM:  VITAL SIGNS: ED Triage Vitals  Enc Vitals Group     BP 04/15/20 0751 (!) 138/96     Pulse Rate 04/15/20 0751 81     Resp 04/15/20 0751 16     Temp 04/15/20 0751 99 F (37.2 C)     Temp Source 04/15/20 0751 Oral     SpO2 04/15/20 0751 100 %     Weight 04/15/20 0752 200 lb (90.7 kg)     Height 04/15/20 0752 5\' 11"  (1.803 m)     Head Circumference --      Peak Flow --      Pain Score 04/15/20 0751 2     Pain Loc --      Pain Edu? --      Excl. in GC? --     Constitutional: Alert and oriented. Eyes: Conjunctivae are normal. Head: Atraumatic. Nose: No congestion/rhinnorhea. Mouth/Throat: Mucous membranes are moist. Neck: Normal ROM Cardiovascular: Normal rate, regular rhythm. Grossly normal heart sounds.  2+ radial pulses bilaterally.   Respiratory: Normal respiratory effort.  No retractions. Lungs CTAB. Gastrointestinal: Soft and nontender. No distention. Genitourinary: deferred Musculoskeletal: No lower extremity tenderness nor edema. Neurologic:  Normal speech and language. No gross focal neurologic deficits are appreciated. Skin:  Skin is  warm, dry and intact. No rash noted. Psychiatric: Mood and affect are normal. Speech and behavior are normal.  ____________________________________________   LABS (all labs ordered are listed, but only abnormal results are displayed)  Labs Reviewed  BASIC METABOLIC PANEL - Abnormal; Notable for the following components:      Result Value   Glucose, Bld 113 (*)    All other components within normal limits  CBC WITH DIFFERENTIAL/PLATELET  TROPONIN I (HIGH SENSITIVITY)   ____________________________________________  EKG  ED ECG REPORT I, Chesley Noon, the attending physician, personally viewed and  interpreted this ECG.   Date: 04/15/2020  EKG Time: 7:46  Rate: 63  Rhythm: normal EKG, normal sinus rhythm, unchanged from previous tracings  Axis: Normal  Intervals:none  ST&T Change: None   PROCEDURES  Procedure(s) performed (including Critical Care):  Procedures   ____________________________________________   INITIAL IMPRESSION / ASSESSMENT AND PLAN / ED COURSE       34 year old male with past medical history of GERD presents to the ED complaining of intermittent chest pain for the past month that has become constant over the past couple of days.  EKG shows no evidence of arrhythmia or ischemia, low suspicion for ACS given atypical symptoms but we will screen troponin.  If this is negative, patient will be appropriate for discharge home given his heart score of less than 4 and constant symptoms for the past couple of days.  I doubt PE as he is PERC negative.  Will also check basic labs and chest x-ray.  Given his history of GERD, we will also trial GI cocktail here in the ED.  Patient reports improvement in his symptoms following GI cocktail.  Work-up has been unremarkable with troponin within normal limits, chest x-ray negative for acute process.  Patient is appropriate for discharge home with PCP follow-up, was counseled to take omeprazole consistently rather than as needed.  He was also counseled to return to the ED for new worsening symptoms, patient agrees with plan.      ____________________________________________   FINAL CLINICAL IMPRESSION(S) / ED DIAGNOSES  Final diagnoses:  Nonspecific chest pain  Gastroesophageal reflux disease with esophagitis without hemorrhage     ED Discharge Orders    None       Note:  This document was prepared using Dragon voice recognition software and may include unintentional dictation errors.   Chesley Noon, MD 04/15/20 [phone]

## 2020-04-21 NOTE — Progress Notes (Signed)
Inetta Fermo Cummings,acting as a scribe for Megan Mans, MD.,have documented all relevant documentation on the behalf of Megan Mans, MD,as directed by  Megan Mans, MD while in the presence of Megan Mans, MD.  Established patient visit   Patient: Cody Marshall   DOB: 1986-02-11   34 y.o. Male  MRN: [id number] Visit Date: 04/23/2020  Today's healthcare provider: Megan Mans, MD   Chief Complaint  Patient presents with  . Anxiety   Subjective    Anxiety Presents for initial visit. Onset was 1 to 4 weeks ago. Symptoms include chest pain, decreased concentration and palpitations. Patient reports no confusion, depressed mood, dizziness, dry mouth, excessive worry, hyperventilation, insomnia, irritability, muscle tension, nausea, nervous/anxious behavior, panic, restlessness or shortness of breath. The symptoms are aggravated by work stress. The quality of sleep is good.   There are no known risk factors. There is no history of anemia, anxiety/panic attacks or bipolar disorder. Past treatments include nothing.   patient went to ER last week for chest pain and nausea and he said that heart problems were ruled out.  Patient thinks this is stress related.  He has had this on and off for several years.  It is not exertional.  It is not sore to the touch.  He occasionally has heartburn or belching.  He has had an EGD for reflux symptoms in the past.      Medications: No outpatient medications prior to visit.   No facility-administered medications prior to visit.    Review of Systems  Constitutional: Negative for irritability.  Respiratory: Negative for shortness of breath.   Cardiovascular: Positive for chest pain and palpitations.  Gastrointestinal: Negative for nausea.  Neurological: Negative for dizziness.  Psychiatric/Behavioral: Positive for decreased concentration. Negative for confusion. The patient is not nervous/anxious and does not have  insomnia.        Objective    BP 131/87 (BP Location: Right Arm, Patient Position: Sitting, Cuff Size: Large)   Pulse (!) 54   Temp (!) 97.3 F (36.3 C) (Temporal)   Ht 5\' 11"  (1.803 m)   Wt 213 lb 12.8 oz (97 kg)   BMI 29.82 kg/m     Physical Exam Vitals reviewed.  Constitutional:      Appearance: Normal appearance.  HENT:     Head: Normocephalic and atraumatic.     Right Ear: External ear normal.     Left Ear: External ear normal.  Eyes:     General: No scleral icterus.    Conjunctiva/sclera: Conjunctivae normal.  Cardiovascular:     Rate and Rhythm: Normal rate and regular rhythm.     Pulses: Normal pulses.     Heart sounds: Normal heart sounds.  Pulmonary:     Effort: Pulmonary effort is normal.     Breath sounds: Normal breath sounds.  Abdominal:     Palpations: Abdomen is soft.  Musculoskeletal:     Right lower leg: No edema.     Left lower leg: No edema.     Comments: Chest wall completely nontender  Skin:    General: Skin is warm and dry.  Neurological:     General: No focal deficit present.     Mental Status: He is alert and oriented to person, place, and time.  Psychiatric:        Mood and Affect: Mood normal.        Behavior: Behavior normal.        Thought  Content: Thought content normal.        Judgment: Judgment normal.       No results found for any visits on 04/23/20.  Assessment & Plan     Problem List Items Addressed This Visit      Digestive   Acid reflux - Primary    Increased omeprazole 20mg  to twice a day Referred to GI for Endoscopy Follow up in 3 months       Relevant Medications   omeprazole (PRILOSEC) 20 MG capsule   Other Relevant Orders   Ambulatory referral to Gastroenterology     Problem List Items Addressed This Visit      Digestive   Acid reflux - Primary    Increased omeprazole 20mg  to twice a day Referred to GI for Endoscopy Follow up in 3 months       Relevant Medications   omeprazole (PRILOSEC) 20  MG capsule   Other Relevant Orders   Ambulatory referral to Gastroenterology     Other   Chest pain, unspecified    I certainly think the chest pain is noncardiac and is not costochondritis.  I think it is due to reflux disease. Anxiety No general treatment needed at this time.  Return in about 3 months (around 07/24/2020).      I, , MD, have reviewed all documentation for this visit. The documentation on 04/25/20 for the exam, diagnosis, procedures, and orders are all accurate and complete.    Cline Draheim Megan Mans, MD  [facility] [phone] (phone) [phone] (fax)  [facility]

## 2020-04-23 ENCOUNTER — Encounter: Payer: Self-pay | Admitting: Family Medicine

## 2020-04-23 ENCOUNTER — Other Ambulatory Visit: Payer: Self-pay

## 2020-04-23 ENCOUNTER — Ambulatory Visit (INDEPENDENT_AMBULATORY_CARE_PROVIDER_SITE_OTHER): Payer: BLUE CROSS/BLUE SHIELD | Admitting: Family Medicine

## 2020-04-23 VITALS — BP 131/87 | HR 54 | Temp 97.3°F | Ht 71.0 in | Wt 213.8 lb

## 2020-04-23 DIAGNOSIS — R0789 Other chest pain: Secondary | ICD-10-CM | POA: Diagnosis not present

## 2020-04-23 DIAGNOSIS — K219 Gastro-esophageal reflux disease without esophagitis: Secondary | ICD-10-CM

## 2020-04-23 DIAGNOSIS — F419 Anxiety disorder, unspecified: Secondary | ICD-10-CM

## 2020-04-23 MED ORDER — OMEPRAZOLE 20 MG PO CPDR: 20.0000 mg | DELAYED_RELEASE_CAPSULE | Freq: Two times a day (BID) | ORAL | 3 refills | Status: DC

## 2020-04-23 NOTE — Assessment & Plan Note (Signed)
Increased omeprazole 20mg  to twice a day Referred to GI for Endoscopy Follow up in 3 months

## 2020-04-25 ENCOUNTER — Encounter: Payer: Self-pay | Admitting: Family Medicine

## 2020-07-02 ENCOUNTER — Other Ambulatory Visit: Payer: Self-pay

## 2020-07-02 ENCOUNTER — Encounter: Payer: Self-pay | Admitting: Gastroenterology

## 2020-07-02 ENCOUNTER — Ambulatory Visit: Payer: BLUE CROSS/BLUE SHIELD | Admitting: Gastroenterology

## 2020-07-02 VITALS — BP 134/85 | HR 66 | Temp 98.4°F | Ht 71.0 in | Wt 217.4 lb

## 2020-07-02 DIAGNOSIS — K31A0 Gastric intestinal metaplasia, unspecified: Secondary | ICD-10-CM

## 2020-07-02 DIAGNOSIS — K3189 Other diseases of stomach and duodenum: Secondary | ICD-10-CM | POA: Diagnosis not present

## 2020-07-02 DIAGNOSIS — K219 Gastro-esophageal reflux disease without esophagitis: Secondary | ICD-10-CM

## 2020-07-03 NOTE — Progress Notes (Signed)
Cody Marshall [address]  [address]  [place], [place] [address]  Main: [phone]  Fax: [phone]   Gastroenterology Consultation  Referring Provider:     Maple Hudson.,* Primary Care Physician:  Maple Hudson., MD Reason for Consultation:     Reflux        HPI:    Chief Complaint  Patient presents with  . Gastroesophageal Reflux    Cody Marshall is a 34 y.o. y/o male referred for consultation & management  by Dr. Sullivan Lone, Leonette Monarch., MD.  Patient reported atypical chest pain to primary care provider and went to ER for the same.  GI cocktail was given in the ER which helped with symptoms.  Patient was started on PPI once daily by primary care provider and this has led to improvement in symptoms.  Patient is aware of foods that can cause his symptoms to return as well and has been avoiding those.  No dysphagia.  No nausea or vomiting.  Review of previous upper endoscopy in 2011 reveals that irregular Z-line was biopsied.  Gastric biopsy showed intestinal metaplasia at that time.  No family history of GI malignancy.  History reviewed. No pertinent past medical history.  Past Surgical History:  Procedure Laterality Date  . NO PAST SURGERIES      Prior to Admission medications   Medication Sig Start Date End Date Taking? Authorizing Provider  omeprazole (PRILOSEC) 20 MG capsule Take 1 capsule (20 mg total) by mouth 2 (two) times daily. 04/23/20  Yes Maple Hudson., MD    Family History  Problem Relation Age of Onset  . Heart disease Maternal Grandfather      Social History   Tobacco Use  . Smoking status: Never Smoker  . Smokeless tobacco: Never Used  Substance Use Topics  . Alcohol use: Yes    Alcohol/week: 0.0 standard drinks    Comment: 2 per week  . Drug use: No    Allergies as of 07/02/2020  . (No Known Allergies)    Review of Systems:    All systems reviewed and negative except where noted in HPI.   Physical  Exam:  BP 134/85   Pulse 66   Temp 98.4 F (36.9 C) (Oral)   Ht 5\' 11"  (1.803 m)   Wt 217 lb 6.4 oz (98.6 kg)   BMI 30.32 kg/m  No LMP for male patient. Psych:  Alert and cooperative. Normal mood and affect. General:   Alert,  Well-developed, well-nourished, pleasant and cooperative in NAD Head:  Normocephalic and atraumatic. Eyes:  Sclera clear, no icterus.   Conjunctiva pink. Ears:  Normal auditory acuity. Nose:  No deformity, discharge, or lesions. Mouth:  No deformity or lesions,oropharynx pink & moist. Neck:  Supple; no masses or thyromegaly. Abdomen:  Normal bowel sounds.  No bruits.  Soft, non-tender and non-distended without masses, hepatosplenomegaly or hernias noted.  No guarding or rebound tenderness.    Msk:  Symmetrical without gross deformities. Good, equal movement & strength bilaterally. Pulses:  Normal pulses noted. Extremities:  No clubbing or edema.  No cyanosis. Neurologic:  Alert and oriented x3;  grossly normal neurologically. Skin:  Intact without significant lesions or rashes. No jaundice. Lymph Nodes:  No significant cervical adenopathy. Psych:  Alert and cooperative. Normal mood and affect.   Labs: CBC    Component Value Date/Time   WBC 4.9 04/15/2020 0818   RBC 5.03 04/15/2020 0818   HGB 15.6 04/15/2020  0818   HGB 15.5 01/18/2018 0937   HCT 44.7 04/15/2020 0818   HCT 45.7 01/18/2018 0937   PLT 243 04/15/2020 0818   PLT 253 01/18/2018 0937   MCV 88.9 04/15/2020 0818   MCV 90 01/18/2018 0937   MCH 31.0 04/15/2020 0818   MCHC 34.9 04/15/2020 0818   RDW 13.5 04/15/2020 0818   RDW 13.8 01/18/2018 0937   LYMPHSABS 1.6 04/15/2020 0818   LYMPHSABS 2.0 01/18/2018 0937   MONOABS 0.5 04/15/2020 0818   EOSABS 0.1 04/15/2020 0818   EOSABS 0.0 01/18/2018 0937   BASOSABS 0.0 04/15/2020 0818   BASOSABS 0.0 01/18/2018 0937   CMP     Component Value Date/Time   NA 138 04/15/2020 0818   NA 139 01/18/2018 0937   K 3.9 04/15/2020 0818   CL 102  04/15/2020 0818   CO2 28 04/15/2020 0818   GLUCOSE 113 (H) 04/15/2020 0818   BUN 15 04/15/2020 0818   BUN 14 01/18/2018 0937   CREATININE 1.13 04/15/2020 0818   CALCIUM 9.4 04/15/2020 0818   PROT 7.2 01/18/2018 0937   ALBUMIN 4.6 01/18/2018 0937   AST 19 01/18/2018 0937   ALT 32 01/18/2018 0937   ALKPHOS 73 01/18/2018 0937   BILITOT 0.7 01/18/2018 0937   GFRNONAA >60 04/15/2020 0818   GFRAA >60 04/15/2020 0818    Imaging Studies: No results found.  Assessment and Plan:   Cody Marshall is a 34 y.o. y/o male has been referred for reflux  Patient educated extensively on acid reflux lifestyle modification, including buying a bed wedge, not eating 3 hrs before bedtime, diet modifications, and handout given for the same.   Since reflux symptoms have improved, okay to discontinue PPI after 3 to 4 weeks and if symptoms return patient advised to let us know  However, given finding of intestinal metaplasia seen on 2011 biopsies, patient does need gastric mapping biopsies  I have discussed alternative options, risks & benefits,  which include, but are not limited to, bleeding, infection, perforation,respiratory complication & drug reaction.  The patient agrees with this plan & written consent will be obtained.       Dr Cody Marshall  Speech recognition software was used to dictate the above note.

## 2020-07-03 NOTE — Addendum Note (Signed)
Addended by: Melodie Bouillon on: 07/03/2020 11:37 AM   Modules accepted: Level of Service

## 2020-07-06 ENCOUNTER — Other Ambulatory Visit
Admission: RE | Admit: 2020-07-06 | Discharge: 2020-07-06 | Disposition: A | Discharge status: Home / Self Care | Payer: BLUE CROSS/BLUE SHIELD | Source: Ambulatory Visit | Attending: Gastroenterology | Admitting: Gastroenterology

## 2020-07-06 ENCOUNTER — Other Ambulatory Visit: Payer: Self-pay

## 2020-07-06 DIAGNOSIS — Z01812 Encounter for preprocedural laboratory examination: Secondary | ICD-10-CM | POA: Diagnosis not present

## 2020-07-06 DIAGNOSIS — Z20822 Contact with and (suspected) exposure to covid-19: Secondary | ICD-10-CM | POA: Insufficient documentation

## 2020-07-07 LAB — SARS CORONAVIRUS 2 (TAT 6-24 HRS): SARS Coronavirus 2: NEGATIVE

## 2020-07-08 ENCOUNTER — Other Ambulatory Visit: Payer: Self-pay

## 2020-07-08 ENCOUNTER — Ambulatory Visit: Payer: BLUE CROSS/BLUE SHIELD | Admitting: Registered Nurse

## 2020-07-08 ENCOUNTER — Encounter
Admission: RE | Disposition: A | Discharge status: Home / Self Care | Payer: Self-pay | Source: Home / Self Care | Attending: Gastroenterology

## 2020-07-08 ENCOUNTER — Ambulatory Visit
Admission: RE | Admit: 2020-07-08 | Discharge: 2020-07-08 | Disposition: A | Discharge status: Home / Self Care | Payer: BLUE CROSS/BLUE SHIELD | Attending: Gastroenterology | Admitting: Gastroenterology

## 2020-07-08 DIAGNOSIS — I890 Lymphedema, not elsewhere classified: Secondary | ICD-10-CM | POA: Diagnosis not present

## 2020-07-08 DIAGNOSIS — K3189 Other diseases of stomach and duodenum: Secondary | ICD-10-CM | POA: Diagnosis not present

## 2020-07-08 DIAGNOSIS — Z79899 Other long term (current) drug therapy: Secondary | ICD-10-CM | POA: Insufficient documentation

## 2020-07-08 DIAGNOSIS — B3781 Candidal esophagitis: Secondary | ICD-10-CM | POA: Diagnosis not present

## 2020-07-08 DIAGNOSIS — K228 Other specified diseases of esophagus: Secondary | ICD-10-CM | POA: Insufficient documentation

## 2020-07-08 DIAGNOSIS — K219 Gastro-esophageal reflux disease without esophagitis: Secondary | ICD-10-CM | POA: Insufficient documentation

## 2020-07-08 DIAGNOSIS — K31A0 Gastric intestinal metaplasia, unspecified: Secondary | ICD-10-CM

## 2020-07-08 HISTORY — PX: ESOPHAGOGASTRODUODENOSCOPY (EGD) WITH PROPOFOL: SHX5813

## 2020-07-08 LAB — KOH PREP
KOH Prep: NONE SEEN
Special Requests: NORMAL

## 2020-07-08 SURGERY — ESOPHAGOGASTRODUODENOSCOPY (EGD) WITH PROPOFOL
Anesthesia: General

## 2020-07-08 MED ORDER — FENTANYL CITRATE (PF) 100 MCG/2ML IJ SOLN
INTRAMUSCULAR | Status: DC | PRN
  Administered 2020-07-08 (×2): 25 ug via INTRAVENOUS
  Administered 2020-07-08: 50 ug via INTRAVENOUS

## 2020-07-08 MED ORDER — PROPOFOL 500 MG/50ML IV EMUL
INTRAVENOUS | Status: AC
  Filled 2020-07-08: qty 50

## 2020-07-08 MED ORDER — FENTANYL CITRATE (PF) 100 MCG/2ML IJ SOLN
INTRAMUSCULAR | Status: AC
  Filled 2020-07-08: qty 2

## 2020-07-08 MED ORDER — MIDAZOLAM HCL 2 MG/2ML IJ SOLN
INTRAMUSCULAR | Status: AC
  Filled 2020-07-08: qty 2

## 2020-07-08 MED ORDER — PROPOFOL 500 MG/50ML IV EMUL
INTRAVENOUS | Status: DC | PRN
  Administered 2020-07-08: 150 ug/kg/min via INTRAVENOUS

## 2020-07-08 MED ORDER — MIDAZOLAM HCL 2 MG/2ML IJ SOLN
INTRAMUSCULAR | Status: DC | PRN
  Administered 2020-07-08: 2 mg via INTRAVENOUS

## 2020-07-08 MED ORDER — SODIUM CHLORIDE 0.9 % IV SOLN
INTRAVENOUS | Status: DC
  Administered 2020-07-08: 20 mL/h via INTRAVENOUS

## 2020-07-08 MED ORDER — PROPOFOL 10 MG/ML IV BOLUS
INTRAVENOUS | Status: DC | PRN
  Administered 2020-07-08: 60 mg via INTRAVENOUS
  Administered 2020-07-08: 20 mg via INTRAVENOUS

## 2020-07-08 MED ORDER — LIDOCAINE HCL (CARDIAC) PF 100 MG/5ML IV SOSY
PREFILLED_SYRINGE | INTRAVENOUS | Status: DC | PRN
  Administered 2020-07-08: 100 mg via INTRAVENOUS

## 2020-07-08 NOTE — Anesthesia Postprocedure Evaluation (Signed)
Anesthesia Post Note  Patient: Cody Marshall  Procedure(s) Performed: ESOPHAGOGASTRODUODENOSCOPY (EGD) WITH PROPOFOL (N/A )  Patient location during evaluation: PACU Anesthesia Type: General Level of consciousness: awake and alert Pain management: pain level controlled Vital Signs Assessment: post-procedure vital signs reviewed and stable Respiratory status: spontaneous breathing, nonlabored ventilation and respiratory function stable Cardiovascular status: blood pressure returned to baseline and stable Postop Assessment: no apparent nausea or vomiting Anesthetic complications: no   No complications documented.   Last Vitals:  Vitals:   07/08/20 1019 07/08/20 1029  BP: (!) 112/93 116/81  Pulse: 60 (!) 57  Resp: (!) 22 16  Temp:    SpO2: 96% 98%    Last Pain:  Vitals:   07/08/20 1029  TempSrc:   PainSc: 0-No pain                 Aurelio Brash Kaitlyne Friedhoff

## 2020-07-08 NOTE — Anesthesia Preprocedure Evaluation (Addendum)
Anesthesia Evaluation  Patient identified by MRN, date of birth, ID band Patient awake    Reviewed: Allergy & Precautions, H&P , NPO status , Patient's Chart, lab work & pertinent test results  History of Anesthesia Complications Negative for: history of anesthetic complications  Airway Mallampati: II  TM Distance: >3 FB Neck ROM: full    Dental  (+) Teeth Intact   Pulmonary neg pulmonary ROS, neg sleep apnea, neg COPD,    breath sounds clear to auscultation       Cardiovascular (-) angina(-) Past MI and (-) Cardiac Stents negative cardio ROS  (-) dysrhythmias  Rhythm:regular Rate:Normal     Neuro/Psych negative neurological ROS  negative psych ROS   GI/Hepatic Neg liver ROS, GERD  ,  Endo/Other  negative endocrine ROS  Renal/GU negative Renal ROS  negative genitourinary   Musculoskeletal   Abdominal   Peds  Hematology negative hematology ROS (+)   Anesthesia Other Findings No past medical history on file.  Past Surgical History: No date: NO PAST SURGERIES  BMI    Body Mass Index: 30.27 kg/m      Reproductive/Obstetrics negative OB ROS                            Anesthesia Physical Anesthesia Plan  ASA: II  Anesthesia Plan: General   Post-op Pain Management:    Induction:   PONV Risk Score and Plan: Propofol infusion and TIVA  Airway Management Planned:   Additional Equipment:   Intra-op Plan:   Post-operative Plan:   Informed Consent: I have reviewed the patients History and Physical, chart, labs and discussed the procedure including the risks, benefits and alternatives for the proposed anesthesia with the patient or authorized representative who has indicated his/her understanding and acceptance.     Dental Advisory Given  Plan Discussed with: Anesthesiologist, CRNA and Surgeon  Anesthesia Plan Comments:         Anesthesia Quick Evaluation

## 2020-07-08 NOTE — Transfer of Care (Signed)
Immediate Anesthesia Transfer of Care Note  Patient: Cody Marshall  Procedure(s) Performed: ESOPHAGOGASTRODUODENOSCOPY (EGD) WITH PROPOFOL (N/A )  Patient Location: Endoscopy Unit  Anesthesia Type:General  Level of Consciousness: drowsy  Airway & Oxygen Therapy: Patient Spontanous Breathing  Post-op Assessment: Report given to RN and Post -op Vital signs reviewed and stable  Post vital signs: Reviewed and stable  Last Vitals:  Vitals Value Taken Time  BP 96/55 07/08/20 0959  Temp    Pulse 61 07/08/20 1000  Resp 15 07/08/20 1000  SpO2 92 % 07/08/20 1000  Vitals shown include unvalidated device data.  Last Pain:  Vitals:   07/08/20 0853  TempSrc: Temporal  PainSc: 0-No pain         Complications: No complications documented.

## 2020-07-08 NOTE — H&P (Signed)
Melodie Bouillon, MD [address], [address], [place], [place], [address] [address], [address], [place], [place], [address] Phone: [phone]  Fax: [phone]  Primary Care Physician:  Maple Hudson., MD   Pre-Procedure History & Physical: HPI:  Cody Marshall is a 34 y.o. male is here for an EGD.   No past medical history on file.  Past Surgical History:  Procedure Laterality Date  . NO PAST SURGERIES      Prior to Admission medications   Medication Sig Start Date End Date Taking? Authorizing Provider  omeprazole (PRILOSEC) 20 MG capsule Take 1 capsule (20 mg total) by mouth 2 (two) times daily. 04/23/20  Yes Maple Hudson., MD    Allergies as of 07/02/2020  . (No Known Allergies)    Family History  Problem Relation Age of Onset  . Heart disease Maternal Grandfather     Social History   Socioeconomic History  . Marital status: Married    Spouse name: Not on file  . Number of children: Not on file  . Years of education: Not on file  . Highest education level: Not on file  Occupational History  . Not on file  Tobacco Use  . Smoking status: Never Smoker  . Smokeless tobacco: Never Used  Substance and Sexual Activity  . Alcohol use: Yes    Alcohol/week: 0.0 standard drinks    Comment: 2 per week  . Drug use: No  . Sexual activity: Not on file  Other Topics Concern  . Not on file  Social History Narrative  . Not on file   Social Determinants of Health   Financial Resource Strain:   . Difficulty of Paying Living Expenses: Not on file  Food Insecurity:   . Worried About Programme researcher, broadcasting/film/video in the Last Year: Not on file  . Ran Out of Food in the Last Year: Not on file  Transportation Needs:   . Lack of Transportation (Medical): Not on file  . Lack of Transportation (Non-Medical): Not on file  Physical Activity:   . Days of Exercise per Week: Not on file  . Minutes of Exercise per Session: Not on file  Stress:   . Feeling of Stress  : Not on file  Social Connections:   . Frequency of Communication with Friends and Family: Not on file  . Frequency of Social Gatherings with Friends and Family: Not on file  . Attends Religious Services: Not on file  . Active Member of Clubs or Organizations: Not on file  . Attends Banker Meetings: Not on file  . Marital Status: Not on file  Intimate Partner Violence:   . Fear of Current or Ex-Partner: Not on file  . Emotionally Abused: Not on file  . Physically Abused: Not on file  . Sexually Abused: Not on file    Review of Systems: See HPI, otherwise negative ROS  Physical Exam: BP 122/86   Pulse 60   Temp (!) 97 F (36.1 C) (Temporal)   Resp 20   Ht 5\' 11"  (1.803 m)   Wt 98.4 kg   SpO2 100%   BMI 30.27 kg/m  General:   Alert,  pleasant and cooperative in NAD Head:  Normocephalic and atraumatic. Neck:  Supple; no masses or thyromegaly. Lungs:  Clear throughout to auscultation, normal respiratory effort.    Heart:  +S1, +S2, Regular rate and rhythm, No edema. Abdomen:  Soft, nontender and nondistended. Normal bowel sounds, without guarding,  and without rebound.   Neurologic:  Alert and  oriented x4;  grossly normal neurologically.  Impression/Plan: Cody Marshall is here for an EGD for gastric intestinal metaplasia on 2011 biopsies.  Risks, benefits, limitations, and alternatives regarding the procedure have been reviewed with the patient.  Questions have been answered.  All parties agreeable.   Pasty Spillers, MD  07/08/2020, 9:21 AM

## 2020-07-08 NOTE — Op Note (Signed)
[facility] Gastroenterology Patient Name: Cody Marshall Procedure Date: 07/08/2020 9:22 AM MRN: [id number] Account #: [id number] Date of Birth: [birth year] Admit Type: Outpatient Age: 34 Room: [facility] ENDO ROOM 2 Gender: Male Note Status: Finalized Procedure:             Upper GI endoscopy Indications:           Follow-up of intestinal metaplasia Providers:             [name] MD, MD Referring MD:          Ferdinand Lango. Sullivan Lone, MD (Referring MD) Medicines:             Monitored Anesthesia Care Complications:         No immediate complications. Procedure:             Pre-Anesthesia Assessment:                        - The risks and benefits of the procedure and the                         sedation options and risks were discussed with the                         patient. All questions were answered and informed                         consent was obtained.                        - Patient identification and proposed procedure were                         verified prior to the procedure.                        - ASA Grade Assessment: II - A patient with mild                         systemic disease.                        After obtaining informed consent, the endoscope was                         passed under direct vision. Throughout the procedure,                         the patient's blood pressure, pulse, and oxygen                         saturations were monitored continuously. The Endoscope                         was introduced through the mouth, and advanced to the                         second part of duodenum. The upper GI endoscopy was  accomplished with ease. The patient tolerated the                         procedure well. Findings:      Islands of salmon-colored mucosa were present. No other visible       abnormalities were present. Mucosa was biopsied with a cold forceps for       histology in a targeted manner and in  4 quadrants. One specimen bottle       was sent to pathology. Only 1 island of salmon colored mucosa was       present.      Patchy, white plaques were found in the proximal esophagus. Brushings       for KOH prep were obtained.      The exam of the esophagus was otherwise normal.      Patchy mildly erythematous mucosa without bleeding was found in the       gastric antrum. Biopsies were taken with a cold forceps for histology.      The exam of the stomach was otherwise normal.      The duodenal bulb was normal.      Lymphangiectasia was present in the second portion of the duodenum.      Patchy atrophic mucosa was found in the second portion of the duodenum.       Biopsies for histology were taken with a cold forceps for evaluation of       celiac disease. Impression:            - Salmon-colored mucosa suspicious for short-segment                         Barrett's esophagus. Biopsied.                        - Esophageal plaques were found, suspicious for                         candidiasis. Brushings performed.                        - Erythematous mucosa in the antrum. Biopsied.                        - Normal duodenal bulb.                        - Duodenal mucosal lymphangiectasia.                        - Duodenal mucosal atrophy. Biopsied. Recommendation:        - Await pathology results.                        - Discharge patient to home.                        - Continue present medications.                        - Resume previous diet.                        - The findings and recommendations were discussed with  the patient.                        - The findings and recommendations were discussed with                         the patient's family.                        - Return to primary care physician as previously                         scheduled.                        - Follow an antireflux regimen. Procedure Code(s):     --- Professional ---                         [phone], Esophagogastroduodenoscopy, flexible,                         transoral; with biopsy, single or multiple Diagnosis Code(s):     --- Professional ---                        K22.8, Other specified diseases of esophagus                        K22.9, Disease of esophagus, unspecified                        K31.89, Other diseases of stomach and duodenum CPT copyright 2019 American Medical Association. All rights reserved. The codes documented in this report are preliminary and upon coder review may  be revised to meet current compliance requirements.  Melodie Bouillon, MD Michel Bickers B. Maximino Greenland MD, MD 07/08/2020 10:01:42 AM This report has been signed electronically. Number of Addenda: 0 Note Initiated On: 07/08/2020 9:22 AM Estimated Blood Loss:  Estimated blood loss: none.      [facility]

## 2020-07-09 ENCOUNTER — Encounter: Payer: Self-pay | Admitting: Gastroenterology

## 2020-07-09 LAB — SURGICAL PATHOLOGY

## 2020-07-10 ENCOUNTER — Telehealth: Payer: Self-pay

## 2020-07-10 NOTE — Telephone Encounter (Signed)
-----   Message from Pasty Spillers, MD sent at 07/09/2020 12:23 PM EDT ----- Kandis Cocking please let the patient know, one of his biopsies showed intestinal metaplasia in his stomach.  This is a change in the tissue of his stomach that would need to be monitored over time.  The recommendations would be to repeat biopsies in 3 years.  This was also present on his biopsies on previous endoscopy.  Please set EGD recall for 3 years

## 2020-07-10 NOTE — Telephone Encounter (Signed)
Called patient to give him his biopsy result as below. Patient understood and had no further questions. He will be placed in our recall list.

## 2020-07-15 ENCOUNTER — Telehealth: Payer: Self-pay

## 2020-07-15 NOTE — Telephone Encounter (Signed)
Patient called stating that he still had several questions to ask the physician so he wanted to schedule an appointment. I asked him if there was any way to help him and he stated that he would prefer to speak to the physician. Therefore, I went ahead and scheduled him a telephone/virtual visit which he agreed.

## 2020-07-20 ENCOUNTER — Telehealth (INDEPENDENT_AMBULATORY_CARE_PROVIDER_SITE_OTHER): Payer: BLUE CROSS/BLUE SHIELD | Admitting: Gastroenterology

## 2020-07-20 ENCOUNTER — Encounter: Payer: Self-pay | Admitting: Gastroenterology

## 2020-07-20 DIAGNOSIS — K219 Gastro-esophageal reflux disease without esophagitis: Secondary | ICD-10-CM

## 2020-07-20 MED ORDER — FAMOTIDINE 20 MG PO TABS: 20.0000 mg | ORAL_TABLET | Freq: Every day | ORAL | 1 refills | Status: DC

## 2020-07-20 NOTE — Patient Instructions (Signed)
Please discontinue taking your Omeprazole and start taking Pepcid 20 MG daily.

## 2020-07-21 NOTE — Progress Notes (Signed)
Cody Bouillon, MD [address]  [address]  [place], [place] [address]  Main: [phone]  Fax: [phone]   Primary Care Physician: Cody Marshall., MD  Virtual Visit via Telephone Note  I connected with patient on 07/21/20 at  9:00 AM EDT by telephone and verified that I am speaking with the correct person using two identifiers.   I discussed the limitations, risks, security and privacy concerns of performing an evaluation and management service by telephone and the availability of in person appointments. I also discussed with the patient that there may be a patient responsible charge related to this service. The patient expressed understanding and agreed to proceed.  Location of Patient: Home Location of Provider: Home Persons involved: Patient and provider only during the visit (nursing staff and front desk staff was involved in communicating with the patient prior to the appointment, reviewing medications and checking them in)   History of Present Illness: Chief Complaint  Patient presents with  . Gastroesophageal Reflux    Patient stated that he continues to have GERD and have questions about it.     HPI: Cody Marshall is a 34 y.o. male here for follow-up of reflux.  Patient was asked to start Pepcid and discontinue Prilosec on last visit.  However, when he did that symptoms came back and so patient has been taking Prilosec instead and not Pepcid.  He takes it once a day.  With the symptoms are well controlled.  No dysphagia.  No nausea or vomiting.  No blood in stool.  He underwent repeat upper endoscopy for gastric intestinal metaplasia biopsies and intestinal metaplasia was seen on the lesser curvature of the gastric antrum.  Patient denies any frequent NSAID use, no smoking.  Current Outpatient Medications  Medication Sig Dispense Refill  . famotidine (PEPCID) 20 MG tablet Take 1 tablet (20 mg total) by mouth daily. 30 tablet 1  . omeprazole  (PRILOSEC) 20 MG capsule Take 1 capsule (20 mg total) by mouth 2 (two) times daily. (Patient not taking: Reported on 07/20/2020) 60 capsule 3   No current facility-administered medications for this visit.    Allergies as of 07/20/2020  . (No Known Allergies)    Review of Systems:    All systems reviewed and negative except where noted in HPI.   Observations/Objective:  Labs: CMP     Component Value Date/Time   NA 138 04/15/2020 0818   NA 139 01/18/2018 0937   K 3.9 04/15/2020 0818   CL 102 04/15/2020 0818   CO2 28 04/15/2020 0818   GLUCOSE 113 (H) 04/15/2020 0818   BUN 15 04/15/2020 0818   BUN 14 01/18/2018 0937   CREATININE 1.13 04/15/2020 0818   CALCIUM 9.4 04/15/2020 0818   PROT 7.2 01/18/2018 0937   ALBUMIN 4.6 01/18/2018 0937   AST 19 01/18/2018 0937   ALT 32 01/18/2018 0937   ALKPHOS 73 01/18/2018 0937   BILITOT 0.7 01/18/2018 0937   GFRNONAA >60 04/15/2020 0818   GFRAA >60 04/15/2020 0818   Lab Results  Component Value Date   WBC 4.9 04/15/2020   HGB 15.6 04/15/2020   HCT 44.7 04/15/2020   MCV 88.9 04/15/2020   PLT 243 04/15/2020    Imaging Studies: No results found.  Assessment and Plan:   Cody Marshall is a 34 y.o. y/o male here for follow-up of reflux and gastric intestinal metaplasia  Assessment and Plan: Reflux well controlled with once daily omeprazole Proper use 30  to 45 minutes before breakfast discussed I have asked him to discontinue the PPI after another month  (Risks of PPI use were discussed with patient including bone loss, C. Diff diarrhea, pneumonia, infections, CKD, electrolyte abnormalities.  Pt. Verbalizes understanding and chooses to continue the medication.)  Patient educated extensively on acid reflux lifestyle modification, including buying a bed wedge, not eating 3 hrs before bedtime, diet modifications, and handout given for the same.   Gastric intestinal metaplasia finding discussed and options of repeat endoscopy for  biopsies in 3 years versus, conservative management and no further biopsies discussed as well.  Patient prefers to proceed with upper endoscopy in 3 years again.  Staff asked to set recall  Patient advised to continue to avoid smoking, frequent NSAID use, and generally eat a healthy diet and avoid high salt foods   Follow Up Instructions:    I discussed the assessment and treatment plan with the patient. The patient was provided an opportunity to ask questions and all were answered. The patient agreed with the plan and demonstrated an understanding of the instructions.   The patient was advised to call back or seek an in-person evaluation if the symptoms worsen or if the condition fails to improve as anticipated.  I provided 12 minutes of non-face-to-face time during this encounter. Additional time was spent in reviewing patient's chart, placing orders etc.   Pasty Spillers, MD  Speech recognition software was used to dictate this note.

## 2020-07-27 NOTE — Progress Notes (Signed)
I,April Miller,acting as a scribe for Megan Mans, MD.,have documented all relevant documentation on the behalf of Megan Mans, MD,as directed by  Megan Mans, MD while in the presence of Megan Mans, MD.   Established patient visit   Patient: Cody Marshall   DOB: 09-14-1986   34 y.o. Male  MRN: [id number] Visit Date: 07/28/2020  Today's healthcare provider: Megan Mans, MD   Chief Complaint  Patient presents with  . Follow-up  . Gastroesophageal Reflux   Subjective    HPI   Acid Reflux From 7/08/20231-Increased omeprazole 20mg  to twice a day. Referred to GI for Endoscopy. Follow up in 3 months.      Medications: Outpatient Medications Prior to Visit  Medication Sig  . famotidine (PEPCID) 20 MG tablet Take 1 tablet (20 mg total) by mouth daily.  omeprazole (PRILOSEC) 20 MG capsule Take 1 capsule (20 mg total) by mouth 2 (two) times daily. (Patient not taking: Reported on 07/20/2020)   No facility-administered medications prior to visit.    Review of Systems  Constitutional: Negative for appetite change, chills and fever.  Respiratory: Negative for chest tightness, shortness of breath and wheezing.   Cardiovascular: Negative for chest pain and palpitations.  Gastrointestinal: Negative for abdominal pain, nausea and vomiting.       Objective    BP 121/85 (BP Location: Right Arm, Patient Position: Sitting, Cuff Size: Large)   Pulse 65   Temp 98.3 F (36.8 C) (Oral)   Resp 16   Ht 5\' 11"  (1.803 m)   Wt 217 lb (98.4 kg)   SpO2 100%   BMI 30.27 kg/m     Physical Exam Vitals reviewed.  Constitutional:      Appearance: Normal appearance.  HENT:     Head: Normocephalic and atraumatic.     Right Ear: External ear normal.     Left Ear: External ear normal.  Eyes:     General: No scleral icterus.    Conjunctiva/sclera: Conjunctivae normal.  Cardiovascular:     Rate and Rhythm: Normal rate and regular rhythm.     Pulses:  Normal pulses.     Heart sounds: Normal heart sounds.  Pulmonary:     Effort: Pulmonary effort is normal.     Breath sounds: Normal breath sounds.  Abdominal:     Palpations: Abdomen is soft.     Tenderness: There is no abdominal tenderness.  Musculoskeletal:     Right lower leg: No edema.     Left lower leg: No edema.     Comments: Chest wall completely nontender  Skin:    General: Skin is warm and dry.  Neurological:     General: No focal deficit present.     Mental Status: He is alert and oriented to person, place, and time.  Psychiatric:        Mood and Affect: Mood normal.        Behavior: Behavior normal.        Thought Content: Thought content normal.        Judgment: Judgment normal.       No results found for any visits on 07/28/20.  Assessment & Plan     1. Gastroesophageal reflux disease without esophagitis We will try to switch from PPI to famotidine.If  He has breakthrough symptoms we will go back to the PPI.   No follow-ups on file.         , MD  [place] [phone] (phone) [phone] (fax)  [place]

## 2020-07-28 ENCOUNTER — Encounter: Payer: Self-pay | Admitting: Family Medicine

## 2020-07-28 ENCOUNTER — Ambulatory Visit (INDEPENDENT_AMBULATORY_CARE_PROVIDER_SITE_OTHER): Payer: BLUE CROSS/BLUE SHIELD | Admitting: Family Medicine

## 2020-07-28 ENCOUNTER — Other Ambulatory Visit: Payer: Self-pay

## 2020-07-28 VITALS — BP 121/85 | HR 65 | Temp 98.3°F | Resp 16 | Ht 71.0 in | Wt 217.0 lb

## 2020-07-28 DIAGNOSIS — K219 Gastro-esophageal reflux disease without esophagitis: Secondary | ICD-10-CM | POA: Diagnosis not present

## 2020-08-21 NOTE — Progress Notes (Signed)
I,April Miller,acting as a scribe for Megan Mans, MD.,have documented all relevant documentation on the behalf of Megan Mans, MD,as directed by  Megan Mans, MD while in the presence of Megan Mans, MD.   Established patient visit   Patient: Cody Marshall   DOB: 1986/08/27   34 y.o. Male  MRN: [id number] Visit Date: 08/24/2020  Today's healthcare provider: Megan Mans, MD   Chief Complaint  Patient presents with  . Leg Pain   Subjective    Leg Pain  The incident occurred more than 1 week ago (2 weeks ago). There was no injury mechanism. The pain is present in the left thigh. The quality of the pain is described as shooting, stabbing, burning and aching. The pain is at a severity of 10/10. The pain is severe. The pain has been intermittent since onset. Associated symptoms include an inability to bear weight. Pertinent negatives include no loss of motion, loss of sensation, muscle weakness, numbness or tingling. He reports no foreign bodies present. He has tried NSAIDs (Advil) for the symptoms. The treatment provided no relief.    Patient states he has had pain in his left thigh for around 2 weeks. Pain is intermittent and severe at times. Patient state he did not do anything to injure himself, his leg just started hurting for no reason. Patient states pain comes on when he is sitting or lying down in the bed. Has treated symptoms with Advil with no relief. He points to discrete area in his left lower anterolateral thigh.  No aggravating or alleviating circumstances.     Medications: Outpatient Medications Prior to Visit  Medication Sig  . omeprazole (PRILOSEC) 20 MG capsule Take 1 capsule (20 mg total) by mouth 2 (two) times daily.  . famotidine (PEPCID) 20 MG tablet Take 1 tablet (20 mg total) by mouth daily. (Patient not taking: Reported on 08/24/2020)   No facility-administered medications prior to visit.    Review of Systems    Constitutional: Negative for appetite change, chills and fever.  Respiratory: Negative for chest tightness, shortness of breath and wheezing.   Cardiovascular: Negative for chest pain and palpitations.  Gastrointestinal: Negative for abdominal pain, nausea and vomiting.  Neurological: Negative for tingling and numbness.       Objective    BP 112/75 (BP Location: Right Arm, Patient Position: Sitting, Cuff Size: Large)   Pulse 71   Temp 98.4 F (36.9 C) (Oral)   Resp 16   Ht 5\' 11"  (1.803 m)   Wt 218 lb (98.9 kg)   HC 16" (40.6 cm)   SpO2 99%   BMI 30.40 kg/m     Physical Exam Vitals reviewed.  Constitutional:      Appearance: Normal appearance.  Eyes:     General: No scleral icterus. Cardiovascular:     Rate and Rhythm: Normal rate and regular rhythm.     Pulses: Normal pulses.     Heart sounds: Normal heart sounds.  Abdominal:     Palpations: Abdomen is soft.  Musculoskeletal:        General: No swelling, tenderness, deformity or signs of injury.     Comments: Normal exam of his left upper leg.  Skin:    General: Skin is warm and dry.  Neurological:     General: No focal deficit present.     Mental Status: He is alert and oriented to person, place, and time.  Psychiatric:  Mood and Affect: Mood normal.        Behavior: Behavior normal.        Thought Content: Thought content normal.        Judgment: Judgment normal.       No results found for any visits on 08/24/20.  Assessment & Plan     1. Muscle strain of left thigh, initial encounter Referral to PT at [facility].  Forward orthopedics if he does not improve. - Ambulatory referral to Physical Therapy   No follow-ups on file.         Leathia Farnell Wendelyn Breslow, MD  [facility] [phone] (phone) [phone] (fax)  [facility]

## 2020-08-24 ENCOUNTER — Ambulatory Visit (INDEPENDENT_AMBULATORY_CARE_PROVIDER_SITE_OTHER): Payer: BLUE CROSS/BLUE SHIELD | Admitting: Family Medicine

## 2020-08-24 ENCOUNTER — Other Ambulatory Visit: Payer: Self-pay

## 2020-08-24 ENCOUNTER — Encounter: Payer: Self-pay | Admitting: Family Medicine

## 2020-08-24 VITALS — BP 112/75 | HR 71 | Temp 98.4°F | Resp 16 | Ht 71.0 in | Wt 218.0 lb

## 2020-08-24 DIAGNOSIS — S76912A Strain of unspecified muscles, fascia and tendons at thigh level, left thigh, initial encounter: Secondary | ICD-10-CM | POA: Diagnosis not present

## 2020-09-23 ENCOUNTER — Encounter: Payer: Self-pay | Admitting: Family Medicine

## 2020-09-23 ENCOUNTER — Ambulatory Visit (INDEPENDENT_AMBULATORY_CARE_PROVIDER_SITE_OTHER): Payer: BLUE CROSS/BLUE SHIELD | Admitting: Family Medicine

## 2020-09-23 ENCOUNTER — Other Ambulatory Visit: Payer: Self-pay

## 2020-09-23 VITALS — BP 127/76 | HR 58 | Temp 98.3°F | Resp 16 | Ht 71.0 in | Wt 219.0 lb

## 2020-09-23 DIAGNOSIS — Z114 Encounter for screening for human immunodeficiency virus [HIV]: Secondary | ICD-10-CM

## 2020-09-23 DIAGNOSIS — Z1389 Encounter for screening for other disorder: Secondary | ICD-10-CM | POA: Diagnosis not present

## 2020-09-23 DIAGNOSIS — Z1159 Encounter for screening for other viral diseases: Secondary | ICD-10-CM | POA: Diagnosis not present

## 2020-09-23 DIAGNOSIS — K219 Gastro-esophageal reflux disease without esophagitis: Secondary | ICD-10-CM | POA: Diagnosis not present

## 2020-09-23 DIAGNOSIS — Z0000 Encounter for general adult medical examination without abnormal findings: Secondary | ICD-10-CM

## 2020-09-23 LAB — POCT URINALYSIS DIPSTICK
Bilirubin, UA: NEGATIVE
Blood, UA: NEGATIVE
Glucose, UA: NEGATIVE
Ketones, UA: NEGATIVE
Leukocytes, UA: NEGATIVE
Nitrite, UA: NEGATIVE
Protein, UA: NEGATIVE
Spec Grav, UA: 1.01 (ref 1.010–1.025)
Urobilinogen, UA: 0.2 E.U./dL
pH, UA: 7 (ref 5.0–8.0)

## 2020-09-23 NOTE — Progress Notes (Signed)
Complete physical exam   Patient: Cody Marshall   DOB: 03-29-1986   34 y.o. Male  MRN: [id number] Visit Date: 09/23/2020  Today's healthcare provider: Wilhemena Durie, MD   Chief Complaint  Patient presents with  . Annual Exam   Subjective    Cody Marshall is a 34 y.o. male who presents today for a complete physical exam.  He reports consuming a general diet. The patient has a physically strenuous job, but has no regular exercise apart from work.  He generally feels well. He reports sleeping well. He does not have additional problems to discuss today.  HPI  Needs follow-up EGD in 2024. Needs daily PPI  History reviewed. No pertinent past medical history. Past Surgical History:  Procedure Laterality Date  . ESOPHAGOGASTRODUODENOSCOPY (EGD) WITH PROPOFOL N/A 07/08/2020   Procedure: ESOPHAGOGASTRODUODENOSCOPY (EGD) WITH PROPOFOL;  Surgeon: Virgel Manifold, MD;  Location: [facility] ENDOSCOPY;  Service: Endoscopy;  Laterality: N/A;  . NO PAST SURGERIES     Social History   Socioeconomic History  . Marital status: Married    Spouse name: Not on file  . Number of children: Not on file  . Years of education: Not on file  . Highest education level: Not on file  Occupational History  . Not on file  Tobacco Use  . Smoking status: Never Smoker  . Smokeless tobacco: Never Used  Substance and Sexual Activity  . Alcohol use: Yes    Alcohol/week: 0.0 standard drinks    Comment: 2 per week  . Drug use: No  . Sexual activity: Not on file  Other Topics Concern  . Not on file  Social History Narrative  . Not on file   Social Determinants of Health   Financial Resource Strain:   . Difficulty of Paying Living Expenses: Not on file  Food Insecurity:   . Worried About Charity fundraiser in the Last Year: Not on file  . Ran Out of Food in the Last Year: Not on file  Transportation Needs:   . Lack of Transportation (Medical): Not on file  . Lack of Transportation  (Non-Medical): Not on file  Physical Activity:   . Days of Exercise per Week: Not on file  . Minutes of Exercise per Session: Not on file  Stress:   . Feeling of Stress : Not on file  Social Connections:   . Frequency of Communication with Friends and Family: Not on file  . Frequency of Social Gatherings with Friends and Family: Not on file  . Attends Religious Services: Not on file  . Active Member of Clubs or Organizations: Not on file  . Attends Archivist Meetings: Not on file  . Marital Status: Not on file  Intimate Partner Violence:   . Fear of Current or Ex-Partner: Not on file  . Emotionally Abused: Not on file  . Physically Abused: Not on file  . Sexually Abused: Not on file   Family Status  Relation Name Status  . Mother  Alive  . Father  Other       unknown health status  . Brother  Alive  . MGF  (Not Specified)   Family History  Problem Relation Age of Onset  . Heart disease Maternal Grandfather    No Known Allergies  Patient Care Team: Jerrol Banana., MD as PCP - General (Family Medicine)   Medications: Outpatient Medications Prior to Visit  Medication Sig  . omeprazole ([place]) 20  MG capsule Take 1 capsule (20 mg total) by mouth 2 (two) times daily.  . [DISCONTINUED] famotidine (PEPCID) 20 MG tablet Take 1 tablet (20 mg total) by mouth daily. (Patient not taking: Reported on 08/24/2020)   No facility-administered medications prior to visit.    Review of Systems  All other systems reviewed and are negative.   Last CBC Lab Results  Component Value Date   WBC 4.9 04/15/2020   HGB 15.6 04/15/2020   HCT 44.7 04/15/2020   MCV 88.9 04/15/2020   MCH 31.0 04/15/2020   RDW 13.5 04/15/2020   PLT 243 04/15/2020   Last metabolic panel Lab Results  Component Value Date   GLUCOSE 113 (H) 04/15/2020   NA 138 04/15/2020   K 3.9 04/15/2020   CL 102 04/15/2020   CO2 28 04/15/2020   BUN 15 04/15/2020   CREATININE 1.13 04/15/2020    GFRNONAA >60 04/15/2020   GFRAA >60 04/15/2020   CALCIUM 9.4 04/15/2020   PROT 7.2 01/18/2018   ALBUMIN 4.6 01/18/2018   LABGLOB 2.6 01/18/2018   AGRATIO 1.8 01/18/2018   BILITOT 0.7 01/18/2018   ALKPHOS 73 01/18/2018   AST 19 01/18/2018   ALT 32 01/18/2018   ANIONGAP 8 04/15/2020   Last lipids Lab Results  Component Value Date   CHOL 185 01/18/2018   HDL 52 01/18/2018   LDLCALC 111 (H) 01/18/2018   TRIG 108 01/18/2018   Last hemoglobin A1c No results found for: HGBA1C Last thyroid functions Lab Results  Component Value Date   TSH 1.560 01/18/2018      Objective    BP 127/76 (BP Location: Right Arm, Patient Position: Sitting, Cuff Size: Large)   Pulse (!) 58   Temp 98.3 F (36.8 C) (Oral)   Resp 16   Ht 5\' 11"  (1.803 m)   Wt 219 lb (99.3 kg)   SpO2 100%   BMI 30.54 kg/m  BP Readings from Last 3 Encounters:  09/23/20 127/76  08/24/20 112/75  07/28/20 121/85   Wt Readings from Last 3 Encounters:  09/23/20 219 lb (99.3 kg)  08/24/20 218 lb (98.9 kg)  07/28/20 217 lb (98.4 kg)      Physical Exam Vitals reviewed.  Constitutional:      Appearance: He is well-developed.  HENT:     Head: Normocephalic and atraumatic.     Right Ear: External ear normal.     Left Ear: External ear normal.     Nose: Nose normal.  Eyes:     Conjunctiva/sclera: Conjunctivae normal.     Pupils: Pupils are equal, round, and reactive to light.  Cardiovascular:     Rate and Rhythm: Normal rate and regular rhythm.     Heart sounds: Normal heart sounds.  Pulmonary:     Effort: Pulmonary effort is normal.     Breath sounds: Normal breath sounds.  Abdominal:     General: Bowel sounds are normal.     Palpations: Abdomen is soft.  Genitourinary:    Penis: Normal.      Testes: Normal.  Musculoskeletal:        General: Normal range of motion.     Cervical back: Normal range of motion and neck supple.  Skin:    General: Skin is warm and dry.  Neurological:     Mental Status:  He is alert and oriented to person, place, and time.  Psychiatric:        Behavior: Behavior normal.        Thought  Content: Thought content normal.        Judgment: Judgment normal.       Last depression screening scores PHQ 2/9 Scores 09/23/2020 04/23/2020 01/18/2018  PHQ - 2 Score 0 0 0  PHQ- 9 Score 0 1 -   Last fall risk screening Fall Risk  09/23/2020  Falls in the past year? 0  Number falls in past yr: 0  Injury with Fall? 0  Risk for fall due to : No Fall Risks  Follow up Falls evaluation completed   Last Audit-C alcohol use screening Alcohol Use Disorder Test (AUDIT) 07/28/2020  1. How often do you have a drink containing alcohol? 3  2. How many drinks containing alcohol do you have on a typical day when you are drinking? 1  3. How often do you have six or more drinks on one occasion? 1  AUDIT-C Score 5  4. How often during the last year have you found that you were not able to stop drinking once you had started? 0  5. How often during the last year have you failed to do what was normally expected from you because of drinking? 0  6. How often during the last year have you needed a first drink in the morning to get yourself going after a heavy drinking session? 0  7. How often during the last year have you had a feeling of guilt of remorse after drinking? 0  8. How often during the last year have you been unable to remember what happened the night before because you had been drinking? 0  9. Have you or someone else been injured as a result of your drinking? 0  10. Has a relative or friend or a doctor or another health worker been concerned about your drinking or suggested you cut down? 0  Alcohol Use Disorder Identification Test Final Score (AUDIT) 5  Alcohol Brief Interventions/Follow-up AUDIT Score <7 follow-up not indicated   A score of 3 or more in women, and 4 or more in men indicates increased risk for alcohol abuse, EXCEPT if all of the points are from question 1   No  results found for any visits on 09/23/20.  Assessment & Plan    Routine Health Maintenance and Physical Exam  Exercise Activities and Dietary recommendations Goals   None     Immunization History  Administered Date(s) Administered  . DTaP 01/14/1988, 03/16/1988, 05/13/1988, 02/10/1989, 12/09/1992  . Hepatitis B 08/02/1999, 09/27/1999, 02/14/2000  . HiB (PRP-OMP) 05/10/1989  . IPV 01/14/1988, 03/16/1988, 02/10/1989, 12/09/1992  . MMR 02/10/1989, 12/09/1992  . Tdap 12/31/2014    Health Maintenance  Topic Date Due  . Hepatitis C Screening  Never done  . COVID-19 Vaccine (1) Never done  . INFLUENZA VACCINE  01/14/2021 (Originally 05/17/2020)  . TETANUS/TDAP  12/30/2024  . HIV Screening  Completed    Discussed health benefits of physical activity, and encouraged him to engage in regular exercise appropriate for his age and condition.  1. Annual physical exam  - TSH - CBC w/Diff/Platelet - Comprehensive metabolic panel - Hepatitis C antibody - HIV Antibody (routine testing w rflx) - Lipid Panel With LDL/HDL Ratio  2. Gastroesophageal reflux disease without esophagitis Ongoing PPI per GI.  3. Screening for blood or protein in urine  - POCT urinalysis dipstick  4. Encounter for hepatitis C screening test for low risk patient   5. Screening for HIV (human immunodeficiency virus)    No follow-ups on file.  Shakerria Parran Cranford Mon, MD  [facility] [phone] (phone) [phone] (fax)  [place]

## 2020-09-23 NOTE — Patient Instructions (Signed)
Preventive Care 19-32 Years Old, Male Preventive care refers to lifestyle choices and visits with your health care provider that can promote health and wellness. This includes:  A yearly physical exam. This is also called an annual well check.  Regular dental and eye exams.  Immunizations.  Screening for certain conditions.  Healthy lifestyle choices, such as eating a healthy diet, getting regular exercise, not using drugs or products that contain nicotine and tobacco, and limiting alcohol use. What can I expect for my preventive care visit? Physical exam Your health care provider will check:  Height and weight. These may be used to calculate body mass index (BMI), which is a measurement that tells if you are at a healthy weight.  Heart rate and blood pressure.  Your skin for abnormal spots. Counseling Your health care provider may ask you questions about:  Alcohol, tobacco, and drug use.  Emotional well-being.  Home and relationship well-being.  Sexual activity.  Eating habits.  Work and work Statistician. What immunizations do I need?  Influenza (flu) vaccine  This is recommended every year. Tetanus, diphtheria, and pertussis (Tdap) vaccine  You may need a Td booster every 10 years. Varicella (chickenpox) vaccine  You may need this vaccine if you have not already been vaccinated. Human papillomavirus (HPV) vaccine  If recommended by your health care provider, you may need three doses over 6 months. Measles, mumps, and rubella (MMR) vaccine  You may need at least one dose of MMR. You may also need a second dose. Meningococcal conjugate (MenACWY) vaccine  One dose is recommended if you are 45-76 years old and a Market researcher living in a residence hall, or if you have one of several medical conditions. You may also need additional booster doses. Pneumococcal conjugate (PCV13) vaccine  You may need this if you have certain conditions and were not  previously vaccinated. Pneumococcal polysaccharide (PPSV23) vaccine  You may need one or two doses if you smoke cigarettes or if you have certain conditions. Hepatitis A vaccine  You may need this if you have certain conditions or if you travel or work in places where you may be exposed to hepatitis A. Hepatitis B vaccine  You may need this if you have certain conditions or if you travel or work in places where you may be exposed to hepatitis B. Haemophilus influenzae type b (Hib) vaccine  You may need this if you have certain risk factors. You may receive vaccines as individual doses or as more than one vaccine together in one shot (combination vaccines). Talk with your health care provider about the risks and benefits of combination vaccines. What tests do I need? Blood tests  Lipid and cholesterol levels. These may be checked every 5 years starting at age 17.  Hepatitis C test.  Hepatitis B test. Screening   Diabetes screening. This is done by checking your blood sugar (glucose) after you have not eaten for a while (fasting).  Sexually transmitted disease (STD) testing. Talk with your health care provider about your test results, treatment options, and if necessary, the need for more tests. Follow these instructions at home: Eating and drinking   Eat a diet that includes fresh fruits and vegetables, whole grains, lean protein, and low-fat dairy products.  Take vitamin and mineral supplements as recommended by your health care provider.  Do not drink alcohol if your health care provider tells you not to drink.  If you drink alcohol: ? Limit how much you have to 0-2  drinks a day. ? Be aware of how much alcohol is in your drink. In the [place] one drink equals one 12 oz bottle of beer (355 mL), one 5 oz glass of wine (148 mL), or one 1 oz glass of hard liquor (44 mL). Lifestyle  Take daily care of your teeth and gums.  Stay active. Exercise for at least 30 minutes on 5 or  more days each week.  Do not use any products that contain nicotine or tobacco, such as cigarettes, e-cigarettes, and chewing tobacco. If you need help quitting, ask your health care provider.  If you are sexually active, practice safe sex. Use a condom or other form of protection to prevent STIs (sexually transmitted infections). What's next?  Go to your health care provider once a year for a well check visit.  Ask your health care provider how often you should have your eyes and teeth checked.  Stay up to date on all vaccines. This information is not intended to replace advice given to you by your health care provider. Make sure you discuss any questions you have with your health care provider. Document Revised: 09/27/2018 Document Reviewed: 09/27/2018 Elsevier Patient Education  2020 [employer].

## 2020-09-25 DIAGNOSIS — Z0000 Encounter for general adult medical examination without abnormal findings: Secondary | ICD-10-CM | POA: Diagnosis not present

## 2020-09-26 LAB — COMPREHENSIVE METABOLIC PANEL
ALT: 46 IU/L — ABNORMAL HIGH (ref 0–44)
AST: 22 IU/L (ref 0–40)
Albumin/Globulin Ratio: 1.5 (ref 1.2–2.2)
Albumin: 4.6 g/dL (ref 4.0–5.0)
Alkaline Phosphatase: 89 IU/L (ref 44–121)
BUN/Creatinine Ratio: 11 (ref 9–20)
BUN: 12 mg/dL (ref 6–20)
Bilirubin Total: 0.5 mg/dL (ref 0.0–1.2)
CO2: 22 mmol/L (ref 20–29)
Calcium: 9.9 mg/dL (ref 8.7–10.2)
Chloride: 105 mmol/L (ref 96–106)
Creatinine, Ser: 1.11 mg/dL (ref 0.76–1.27)
GFR calc Af Amer: 101 mL/min/{1.73_m2} (ref >59)
GFR calc non Af Amer: 87 mL/min/{1.73_m2} (ref >59)
Globulin, Total: 3 g/dL (ref 1.5–4.5)
Glucose: 108 mg/dL — ABNORMAL HIGH (ref 65–99)
Potassium: 5.2 mmol/L (ref 3.5–5.2)
Sodium: 145 mmol/L — ABNORMAL HIGH (ref 134–144)
Total Protein: 7.6 g/dL (ref 6.0–8.5)

## 2020-09-26 LAB — CBC WITH DIFFERENTIAL/PLATELET
Basophils Absolute: 0 10*3/uL (ref 0.0–0.2)
Basos: 0 %
EOS (ABSOLUTE): 0 10*3/uL (ref 0.0–0.4)
Eos: 1 %
Hematocrit: 47.4 % (ref 37.5–51.0)
Hemoglobin: 15.8 g/dL (ref 13.0–17.7)
Immature Grans (Abs): 0 10*3/uL (ref 0.0–0.1)
Immature Granulocytes: 0 %
Lymphocytes Absolute: 1.6 10*3/uL (ref 0.7–3.1)
Lymphs: 35 %
MCH: 30.6 pg (ref 26.6–33.0)
MCHC: 33.3 g/dL (ref 31.5–35.7)
MCV: 92 fL (ref 79–97)
Monocytes Absolute: 0.5 10*3/uL (ref 0.1–0.9)
Monocytes: 11 %
Neutrophils Absolute: 2.5 10*3/uL (ref 1.4–7.0)
Neutrophils: 53 %
Platelets: 231 10*3/uL (ref 150–450)
RBC: 5.17 x10E6/uL (ref 4.14–5.80)
RDW: 12.7 % (ref 11.6–15.4)
WBC: 4.7 10*3/uL (ref 3.4–10.8)

## 2020-09-26 LAB — HEPATITIS C ANTIBODY: Hep C Virus Ab: <0.1 s/co ratio (ref 0.0–0.9)

## 2020-09-26 LAB — LIPID PANEL WITH LDL/HDL RATIO
Cholesterol, Total: 199 mg/dL (ref 100–199)
HDL: 47 mg/dL (ref >39)
LDL Chol Calc (NIH): 129 mg/dL — ABNORMAL HIGH (ref 0–99)
LDL/HDL Ratio: 2.7 ratio (ref 0.0–3.6)
Triglycerides: 127 mg/dL (ref 0–149)
VLDL Cholesterol Cal: 23 mg/dL (ref 5–40)

## 2020-09-26 LAB — TSH: TSH: 1.63 u[IU]/mL (ref 0.450–4.500)

## 2020-09-26 LAB — HIV ANTIBODY (ROUTINE TESTING W REFLEX): HIV Screen 4th Generation wRfx: NONREACTIVE

## 2020-10-25 ENCOUNTER — Ambulatory Visit (INDEPENDENT_AMBULATORY_CARE_PROVIDER_SITE_OTHER): Payer: BLUE CROSS/BLUE SHIELD

## 2020-10-25 ENCOUNTER — Encounter: Payer: Self-pay | Admitting: Emergency Medicine

## 2020-10-25 ENCOUNTER — Ambulatory Visit
Admission: EM | Admit: 2020-10-25 | Discharge: 2020-10-25 | Disposition: A | Discharge status: Home / Self Care | Payer: BLUE CROSS/BLUE SHIELD | Attending: Family Medicine | Admitting: Family Medicine

## 2020-10-25 DIAGNOSIS — Z9189 Other specified personal risk factors, not elsewhere classified: Secondary | ICD-10-CM

## 2020-10-25 DIAGNOSIS — R52 Pain, unspecified: Secondary | ICD-10-CM

## 2020-10-25 DIAGNOSIS — U071 COVID-19: Secondary | ICD-10-CM

## 2020-10-25 DIAGNOSIS — R059 Cough, unspecified: Secondary | ICD-10-CM

## 2020-10-25 DIAGNOSIS — J1282 Pneumonia due to coronavirus disease 2019: Secondary | ICD-10-CM

## 2020-10-25 DIAGNOSIS — R5383 Other fatigue: Secondary | ICD-10-CM | POA: Diagnosis not present

## 2020-10-25 DIAGNOSIS — R432 Parageusia: Secondary | ICD-10-CM

## 2020-10-25 DIAGNOSIS — R0982 Postnasal drip: Secondary | ICD-10-CM

## 2020-10-25 DIAGNOSIS — J189 Pneumonia, unspecified organism: Secondary | ICD-10-CM | POA: Diagnosis not present

## 2020-10-25 HISTORY — DX: Gastro-esophageal reflux disease without esophagitis: K21.9

## 2020-10-25 MED ORDER — PROMETHAZINE-DM 6.25-15 MG/5ML PO SYRP: 5.0000 mL | ORAL_SOLUTION | Freq: Four times a day (QID) | ORAL | 0 refills | Status: DC | PRN

## 2020-10-25 MED ORDER — PREDNISONE 10 MG (21) PO TBPK: ORAL_TABLET | Freq: Every day | ORAL | 0 refills | Status: AC

## 2020-10-25 NOTE — ED Provider Notes (Signed)
[facility]   [id number] 10/25/20 Arrival Time: 1058   CC: COVID symptoms  SUBJECTIVE: History from: patient.  Cody Marshall is a 35 y.o. male who presents with cough, fatigue, body aches since 10/16/20. Has positive Covid test at home. Denies sick exposure to COVID, flu or strep. Denies recent travel. Has not completed Covid vaccines, has not had flu vaccine. Has not taken OTC medications for this. Cough is worse at night and aggravated by acitivity. Denies previous symptoms in the past. Denies fever, chills, sinus pain, rhinorrhea, sore throat, SOB, wheezing, chest pain, nausea, changes in bowel or bladder habits.    ROS: As per HPI.  All other pertinent ROS negative.     Past Medical History:  Diagnosis Date  . Acid reflux    Past Surgical History:  Procedure Laterality Date  . ESOPHAGOGASTRODUODENOSCOPY (EGD) WITH PROPOFOL N/A 07/08/2020   Procedure: ESOPHAGOGASTRODUODENOSCOPY (EGD) WITH PROPOFOL;  Surgeon: Pasty Spillers, MD;  Location: [facility] ENDOSCOPY;  Service: Endoscopy;  Laterality: N/A;  . NO PAST SURGERIES     No Known Allergies No current facility-administered medications on file prior to encounter.   Current Outpatient Medications on File Prior to Encounter  Medication Sig Dispense Refill  . omeprazole (PRILOSEC) 20 MG capsule Take 1 capsule (20 mg total) by mouth 2 (two) times daily. 60 capsule 3   Social History   Socioeconomic History  . Marital status: Married    Spouse name: Not on file  . Number of children: Not on file  . Years of education: Not on file  . Highest education level: Not on file  Occupational History  . Not on file  Tobacco Use  . Smoking status: Never Smoker  . Smokeless tobacco: Never Used  Substance and Sexual Activity  . Alcohol use: Yes    Alcohol/week: 0.0 standard drinks    Comment: 2 per week  . Drug use: No  . Sexual activity: Not on file  Other Topics Concern  . Not on file  Social History Narrative  .  Not on file   Social Determinants of Health   Financial Resource Strain: Not on file  Food Insecurity: Not on file  Transportation Needs: Not on file  Physical Activity: Not on file  Stress: Not on file  Social Connections: Not on file  Intimate Partner Violence: Not on file   Family History  Problem Relation Age of Onset  . Heart disease Maternal Grandfather     OBJECTIVE:  Vitals:   10/25/20 1124 10/25/20 1136  BP: (!) 143/84   Pulse: (!) 116   Resp: 16   Temp: 99.5 F (37.5 C)   TempSrc: Oral   SpO2: 95%   Weight:  210 lb (95.3 kg)  Height:  6' (1.829 m)     General appearance: alert; appears fatigued, but nontoxic; speaking in full sentences and tolerating own secretions HEENT: NCAT; Ears: EACs clear, TMs pearly gray; Eyes: PERRL.  EOM grossly intact. Sinuses: nontender; Nose: nares patent with clear rhinorrhea, Throat: oropharynx erythematous, cobblestoning present, tonsils non erythematous or enlarged, uvula midline  Neck: supple without LAD Lungs: unlabored respirations, symmetrical air entry; cough: moderate; no respiratory distress; coarse lung sounds bilaterally, diminished to lower lobes Heart: regular rate and rhythm.  Radial pulses 2+ symmetrical bilaterally Skin: warm and dry Psychological: alert and cooperative; normal mood and affect  LABS:  No results found for this or any previous visit (from the past 24 hour(s)).   ASSESSMENT & PLAN:  1.  Pneumonia due to COVID-19 virus   2. At increased risk of exposure to COVID-19 virus   3. Cough   4. Other fatigue   5. Body aches   6. Post-nasal drip   7. Loss of taste     Meds ordered this encounter  Medications  . predniSONE (STERAPRED UNI-PAK 21 TAB) 10 MG (21) TBPK tablet    Sig: Take by mouth daily for 6 days. Take 6 tablets on day 1, 5 tablets on day 2, 4 tablets on day 3, 3 tablets on day 4, 2 tablets on day 5, 1 tablet on day 6    Dispense:  21 tablet    Refill:  0    Order Specific Question:    Supervising Provider    Answer:   Merrilee Jansky [id number]  . promethazine-dextromethorphan (PROMETHAZINE-DM) 6.25-15 MG/5ML syrup    Sig: Take 5 mLs by mouth 4 (four) times daily as needed for cough.    Dispense:  118 mL    Refill:  0    Order Specific Question:   Supervising Provider    Answer:   Merrilee Jansky [id number]   Chest xray shows Covid pneumonia Steroid taper prescribed  Promethazine cough syrup prescribed, sedation precautions given Continue supportive care at home COVID and flu testing ordered.  It will take between 2-3 days for test results. Someone will contact you regarding abnormal results.   Work note provided Patient should remain in quarantine until they have received Covid results.  If negative you may resume normal activities (go back to work/school) while practicing hand hygiene, social distance, and mask wearing.  If positive, patient should remain in quarantine for at least 5 days from symptom onset AND greater than 72 hours after symptoms resolution (absence of fever without the use of fever-reducing medication and improvement in respiratory symptoms), whichever is longer Get plenty of rest and push fluids Use OTC zyrtec for nasal congestion, runny nose, and/or sore throat Use OTC flonase for nasal congestion and runny nose Use medications daily for symptom relief Use OTC medications like ibuprofen or tylenol as needed fever or pain Call or go to the ED if you have any new or worsening symptoms such as fever, worsening cough, shortness of breath, chest tightness, chest pain, turning blue, changes in mental status.  Reviewed expectations re: course of current medical issues. Questions answered. Outlined signs and symptoms indicating need for more acute intervention. Patient verbalized understanding. After Visit Summary given.         Moshe Cipro, NP 10/25/20 1218

## 2020-10-25 NOTE — ED Triage Notes (Signed)
Cough since 12/31.  Took home test yesterday and test was positive.  Productive cough since last night.

## 2020-10-25 NOTE — Discharge Instructions (Addendum)
Chest xray shows Covid pneumonia today  I have sent in a prednisone taper for you to take for 6 days. 6 tablets on day one, 5 tablets on day two, 4 tablets on day three, 3 tablets on day four, 2 tablets on day five, and 1 tablet on day six.  I have sent in cough syrup for you to take. This medication can make you sleepy. Do not drive while taking this medication.  Follow up with this office or with primary care if symptoms are persisting.  Follow up in the ER for high fever, trouble swallowing, trouble breathing, other concerning symptoms.

## 2020-10-29 LAB — COVID-19, FLU A+B NAA
Influenza A, NAA: NOT DETECTED
Influenza B, NAA: NOT DETECTED
SARS-CoV-2, NAA: DETECTED — AB

## 2021-01-04 IMAGING — CR DG CHEST 2V
1 series · 1 of 1 positions shown · non-contrast
Comparison: 05/25/2016

CLINICAL DATA: Chest pain

EXAM:
CHEST - 2 VIEW

[chest lat]
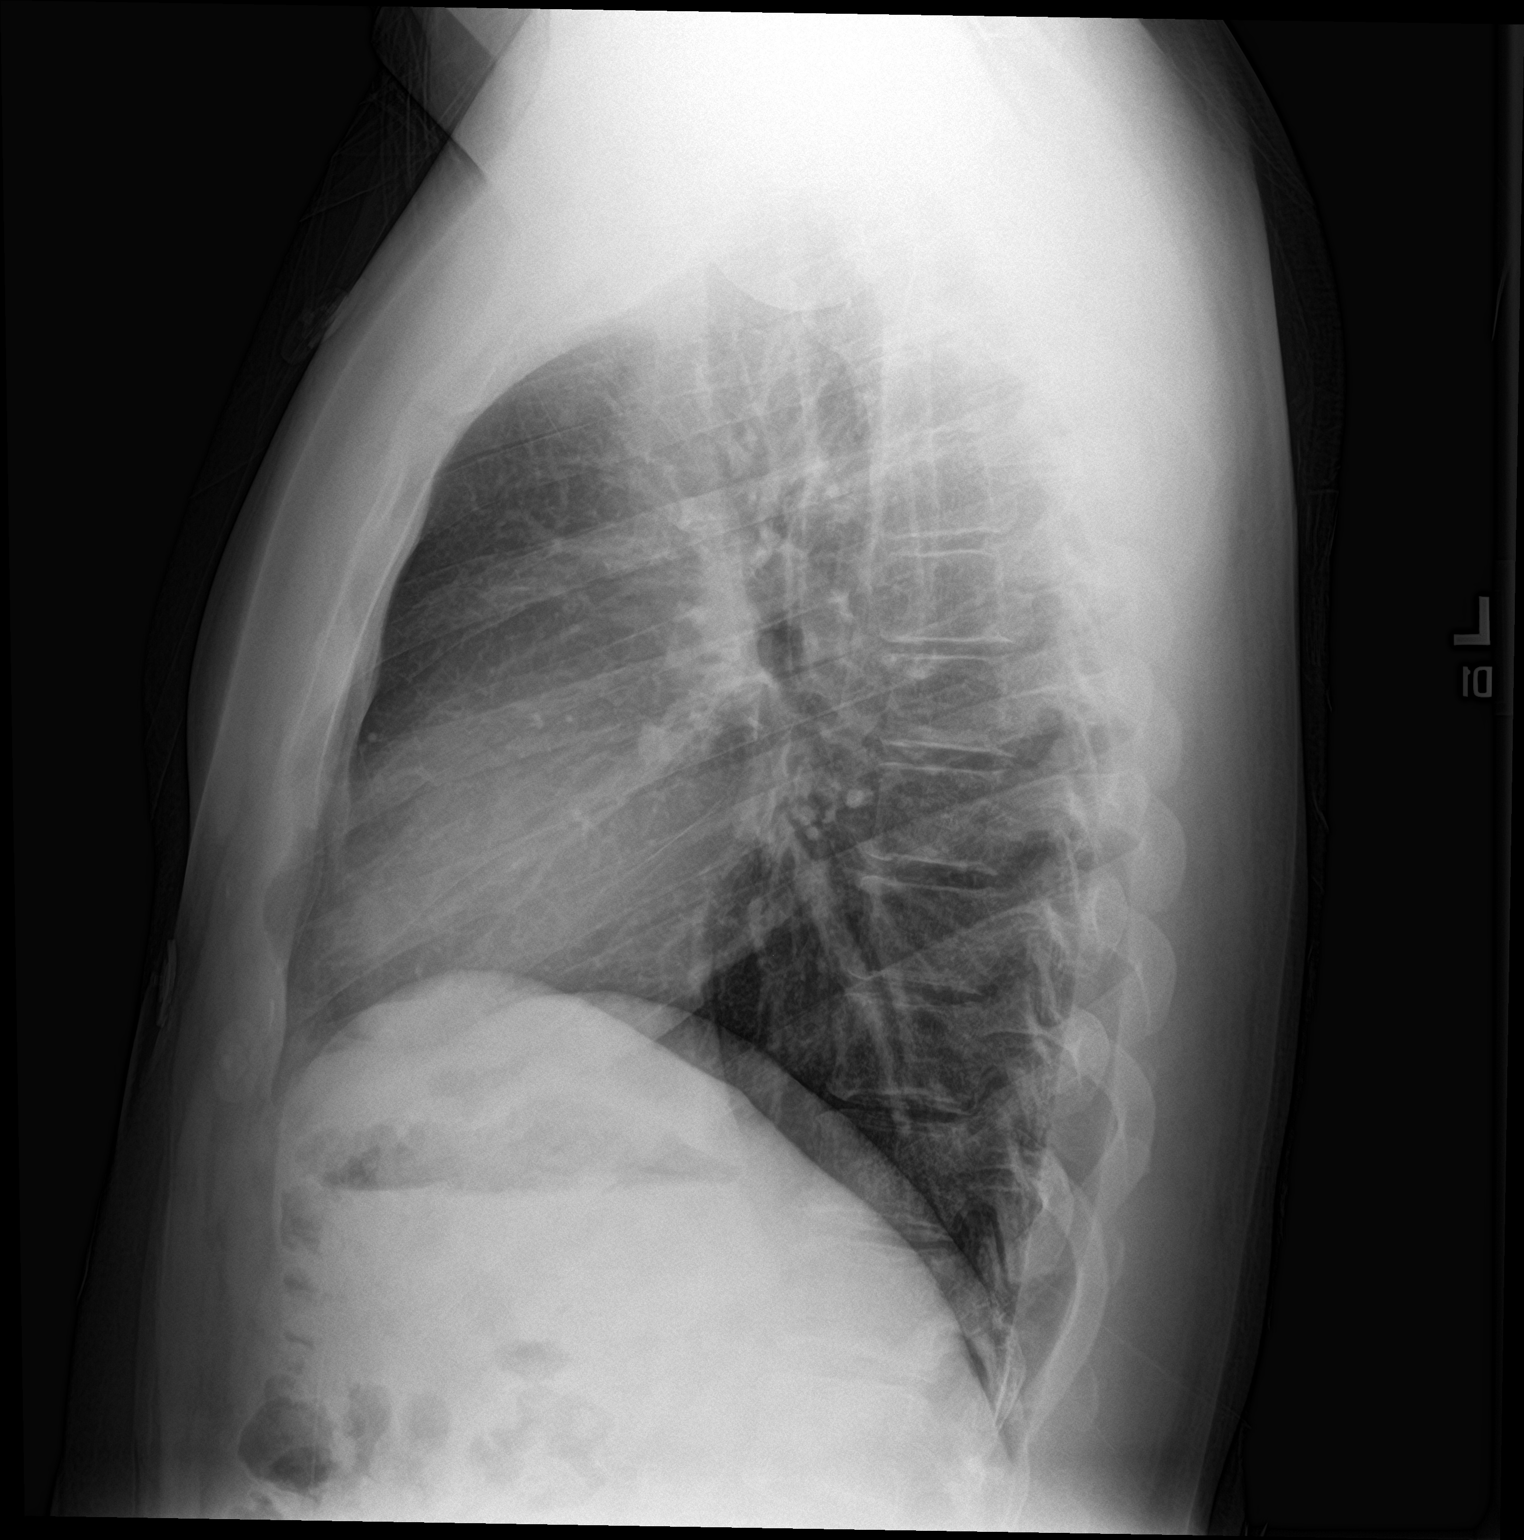

[1 of 1 positions shown; findings below may reference images not displayed]

FINDINGS: Normal heart size. No pleural effusion or edema. No airspace
densities identified. No radio-opaque foreign body or soft tissue
calcification.
IMPRESSION: Negative.

## 2021-07-16 IMAGING — DX DG CHEST 2V
2 series · 2 of 2 positions shown · non-contrast
Comparison: 04/15/2020

CLINICAL DATA: Cough, COVID positive

EXAM:
CHEST - 2 VIEW

[chest pa]
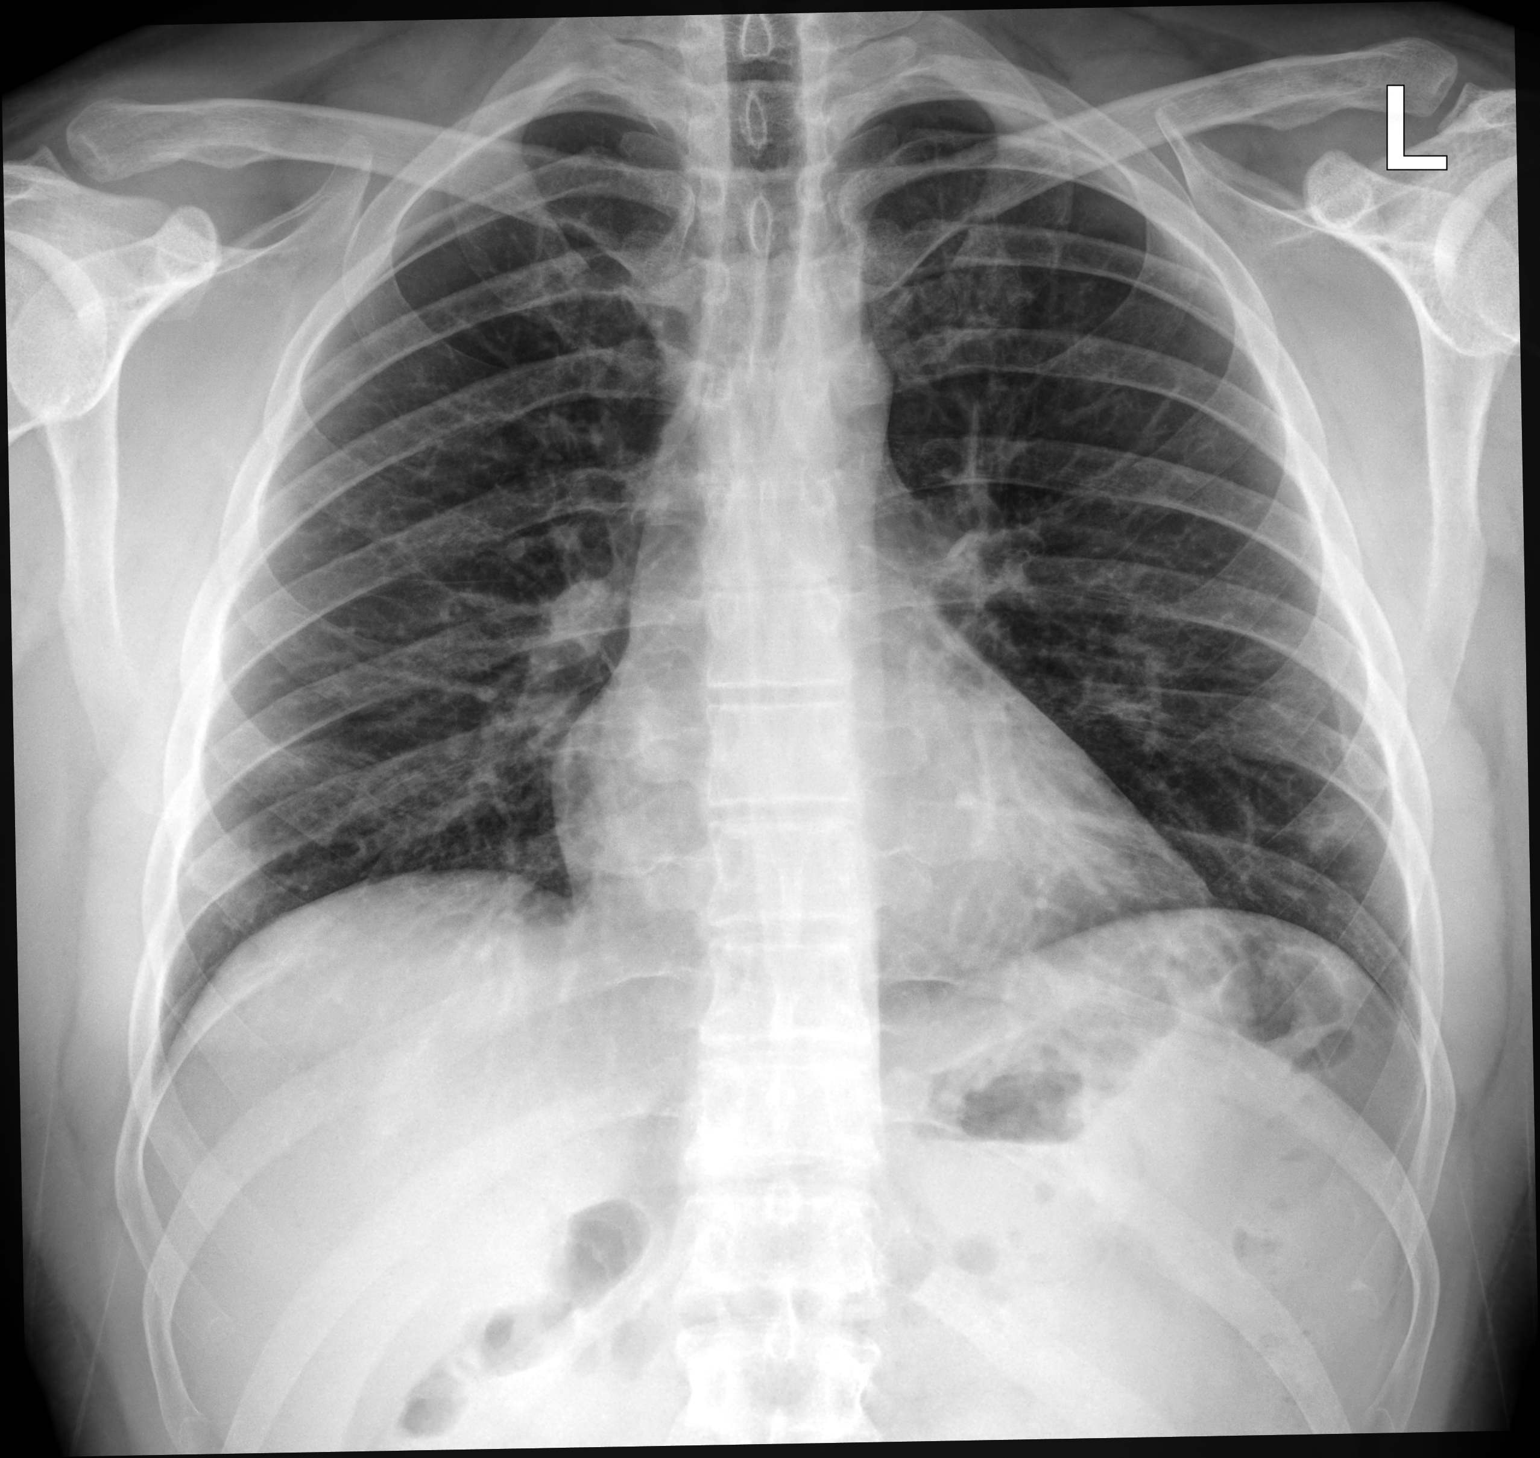

[chest lat]
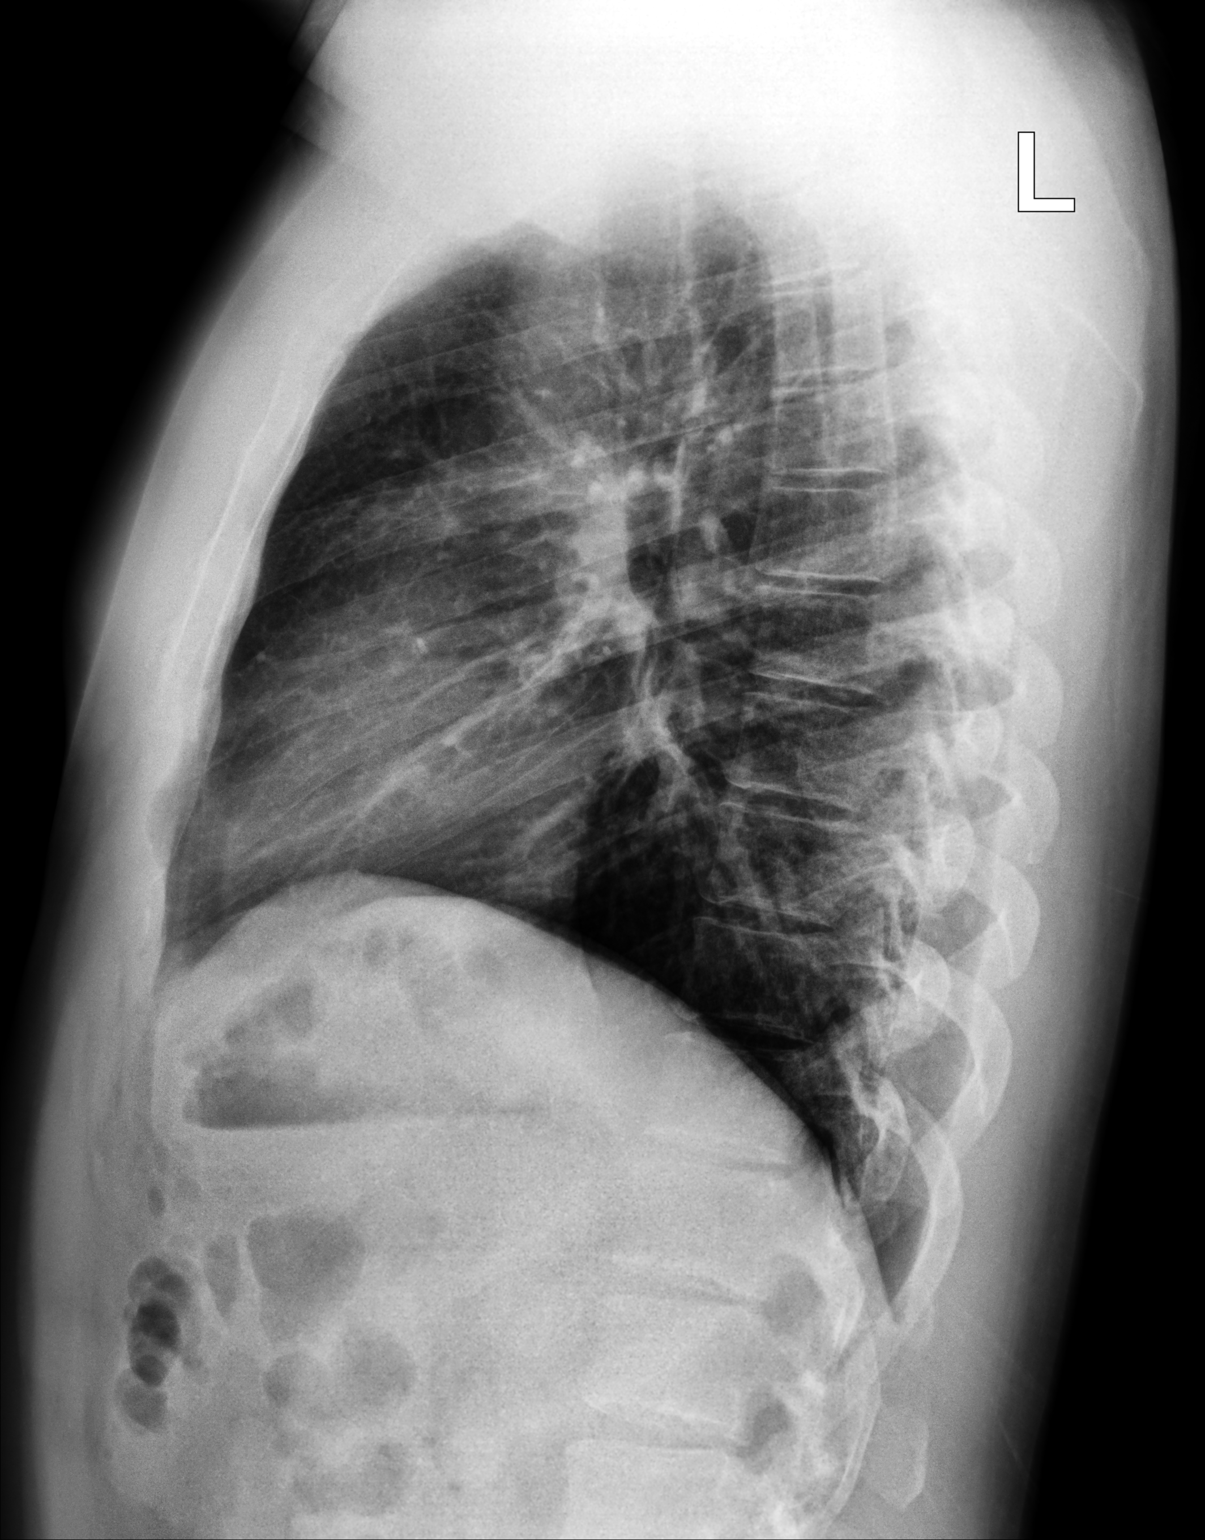

[2 of 2 positions shown; findings below may reference images not displayed]

FINDINGS: Very minimal peripheral basilar patchy interstitial opacities
overlying the rib shadows compatible with early patchy pneumonia
related to COVID.

Normal heart size and vascularity. Trachea midline. No edema,
effusion or pneumothorax. Nonobstructive bowel gas pattern.
IMPRESSION: Minimal bibasilar pneumonia changes related to COVID.

## 2021-07-29 DIAGNOSIS — M7541 Impingement syndrome of right shoulder: Secondary | ICD-10-CM | POA: Insufficient documentation

## 2021-07-29 DIAGNOSIS — M25511 Pain in right shoulder: Secondary | ICD-10-CM | POA: Diagnosis not present

## 2021-09-30 ENCOUNTER — Telehealth: Payer: Self-pay

## 2021-09-30 NOTE — Telephone Encounter (Signed)
Copied from CRM [id number]. Topic: General - Other >> Sep 30, 2021  1:42 PM Fields, Hospital doctor R wrote: Reason for CRM: Patient calling in to schedule is annual physical for work, dr Sullivan Lone doesn't have an appt until April 5 and pt said that wouldn't work for him. He's requesting a call back at [phone]

## 2021-09-30 NOTE — Telephone Encounter (Signed)
Apt 10/25/2021 at 1:40 with Dr. Luanne Bras.   Thanks,   -Vernona Rieger

## 2021-10-25 ENCOUNTER — Ambulatory Visit (INDEPENDENT_AMBULATORY_CARE_PROVIDER_SITE_OTHER): Payer: BC Managed Care – PPO | Admitting: Family Medicine

## 2021-10-25 ENCOUNTER — Encounter: Payer: Self-pay | Admitting: Family Medicine

## 2021-10-25 ENCOUNTER — Other Ambulatory Visit: Payer: Self-pay

## 2021-10-25 VITALS — BP 123/81 | HR 76 | Temp 98.2°F | Resp 16 | Ht 71.0 in | Wt 199.0 lb

## 2021-10-25 DIAGNOSIS — Z0000 Encounter for general adult medical examination without abnormal findings: Secondary | ICD-10-CM | POA: Diagnosis not present

## 2021-10-25 DIAGNOSIS — K219 Gastro-esophageal reflux disease without esophagitis: Secondary | ICD-10-CM

## 2021-10-25 DIAGNOSIS — Z1329 Encounter for screening for other suspected endocrine disorder: Secondary | ICD-10-CM | POA: Diagnosis not present

## 2021-10-25 DIAGNOSIS — Z130 Encounter for screening for diseases of the blood and blood-forming organs and certain disorders involving the immune mechanism: Secondary | ICD-10-CM | POA: Diagnosis not present

## 2021-10-25 DIAGNOSIS — K31A0 Gastric intestinal metaplasia, unspecified: Secondary | ICD-10-CM | POA: Diagnosis not present

## 2021-10-25 NOTE — Progress Notes (Signed)
BP 123/81 (BP Location: Right Arm, Patient Position: Sitting, Cuff Size: Large)    Pulse 76    Temp 98.2 F (36.8 C) (Temporal)    Resp 16    Ht 5\' 11"  (1.803 m)    Wt 199 lb (90.3 kg)    SpO2 98%    BMI 27.75 kg/m    Subjective:    Patient ID: Cody Marshall, male    DOB: July 10, 1986, 36 y.o.   MRN: [id number]  HPI: Cody Marshall is a 36 y.o. male presenting on 10/25/2021 for comprehensive medical examination. Current medical complaints include:none  Intestinal metaplasia - per EGD 2021. On omeprazole prn with good control. Follows with GI. Non smoker.  He currently lives with: wife Interim Problems from his last visit: yes - COVID PNA 10/2020, doing well  Depression Screen done today and results listed below:  [facility] 10/25/2021 09/23/2020 04/23/2020 01/18/2018 05/17/2017  Decreased Interest 0 0 0 0 0  Down, Depressed, Hopeless 0 0 0 0 0  PHQ - 2 Score 0 0 0 0 0  Altered sleeping 0 0 0 - -  Tired, decreased energy 0 0 1 - -  Change in appetite 0 0 0 - -  Feeling bad or failure about yourself  0 0 0 - -  Trouble concentrating 0 0 0 - -  Moving slowly or fidgety/restless 0 0 0 - -  Suicidal thoughts 0 0 0 - -  PHQ-9 Score 0 0 1 - -  Difficult doing work/chores Not difficult at all Not difficult at all Not difficult at all - -    The patient does not have a history of falls. I did not complete a risk assessment for falls. A plan of care for falls was not documented.   Past Medical History:  Past Medical History:  Diagnosis Date   Acid reflux     Surgical History:  Past Surgical History:  Procedure Laterality Date   ESOPHAGOGASTRODUODENOSCOPY (EGD) WITH PROPOFOL N/A 07/08/2020   Procedure: ESOPHAGOGASTRODUODENOSCOPY (EGD) WITH PROPOFOL;  Surgeon: Pasty Spillersahiliani, Varnita B, MD;  Location: [facility];  Service: Endoscopy;  Laterality: N/A;   NO PAST SURGERIES      Medications:  Current Outpatient Medications on File Prior to Visit  Medication Sig   omeprazole  (PRILOSEC) 20 MG capsule Take 1 capsule (20 mg total) by mouth 2 (two) times daily.   No current facility-administered medications on file prior to visit.    Allergies:  No Known Allergies  Social History:  Social History   Socioeconomic History   Marital status: Married    Spouse name: Not on file   Number of children: Not on file   Years of education: Not on file   Highest education level: Not on file  Occupational History   Not on file  Tobacco Use   Smoking status: Never   Smokeless tobacco: Never  Substance and Sexual Activity   Alcohol use: Yes    Alcohol/week: 0.0 standard drinks    Comment: 2 per week   Drug use: No   Sexual activity: Not on file  Other Topics Concern   Not on file  Social History Narrative   Not on file   Social Determinants of Health   Financial Resource Strain: Not on file  Food Insecurity: Not on file  Transportation Needs: Not on file  Physical Activity: Not on file  Stress: Not on file  Social Connections: Not on file  Intimate Partner Violence:  Not on file   Social History   Tobacco Use  Smoking Status Never  Smokeless Tobacco Never   Social History   Substance and Sexual Activity  Alcohol Use Yes   Alcohol/week: 0.0 standard drinks   Comment: 2 per week    Family History:  Family History  Problem Relation Age of Onset   Heart disease Maternal Grandfather     Past medical history, surgical history, medications, allergies, family history and social history reviewed with patient today and changes made to appropriate areas of the chart.      Objective:    BP 123/81 (BP Location: Right Arm, Patient Position: Sitting, Cuff Size: Large)    Pulse 76    Temp 98.2 F (36.8 C) (Temporal)    Resp 16    Ht 5\' 11"  (1.803 m)    Wt 199 lb (90.3 kg)    SpO2 98%    BMI 27.75 kg/m   Wt Readings from Last 3 Encounters:  10/25/21 199 lb (90.3 kg)  10/25/20 210 lb (95.3 kg)  09/23/20 219 lb (99.3 kg)    Physical Exam Vitals  reviewed.  Constitutional:      Appearance: Normal appearance. He is normal weight. He is not ill-appearing.  HENT:     Right Ear: External ear normal.     Left Ear: External ear normal.     Nose: Nose normal.     Mouth/Throat:     Mouth: Mucous membranes are moist.     Pharynx: Oropharynx is clear. No oropharyngeal exudate or posterior oropharyngeal erythema.  Eyes:     Extraocular Movements: Extraocular movements intact.     Pupils: Pupils are equal, round, and reactive to light.  Cardiovascular:     Rate and Rhythm: Normal rate and regular rhythm.     Heart sounds: Normal heart sounds. No murmur heard. Pulmonary:     Effort: Pulmonary effort is normal. No respiratory distress.     Breath sounds: Normal breath sounds.  Abdominal:     General: Bowel sounds are normal.     Palpations: There is no mass.     Tenderness: There is no abdominal tenderness.  Musculoskeletal:        General: Normal range of motion.     Right lower leg: No edema.     Left lower leg: No edema.  Lymphadenopathy:     Cervical: No cervical adenopathy.  Skin:    General: Skin is warm and dry.  Neurological:     Mental Status: He is alert and oriented to person, place, and time. Mental status is at baseline.     Gait: Gait normal.  Psychiatric:        Mood and Affect: Mood normal.        Behavior: Behavior normal.    Results for orders placed or performed during the hospital encounter of 10/25/20  Covid-19, Flu A+B ([facility])   Specimen: Nasopharyngeal   Naso  Result Value Ref Range   SARS-CoV-2, NAA Detected (A) Not Detected   Influenza A, NAA Not Detected Not Detected   Influenza B, NAA Not Detected Not Detected      Assessment & Plan:   Problem List Items Addressed This Visit       Digestive   Acid reflux    Doing well on current regimen, no changes made today.      Intestinal metaplasia of gastric mucosa    Tolerant of PPI, continue. Continue to follow with GI.  Other Visit  Diagnoses     Annual physical exam    -  Primary   Relevant Orders   Lipid panel   Hemoglobin A1c   CBC w/Diff/Platelet   Comprehensive Metabolic Panel (CMET)        LABORATORY TESTING:  Health maintenance labs ordered today as discussed above.   IMMUNIZATIONS:   - Tdap: Tetanus vaccination status reviewed: last tetanus booster within 10 years. - Influenza: Refused - Pneumococcal: Not applicable - HPV: Not applicable - Shingrix vaccine: Not applicable - COVID vaccine: refused  SCREENING: - Colonoscopy: Not applicable  Discussed with patient purpose of the colonoscopy is to detect colon cancer at curable precancerous or early stages   - AAA Screening: Not applicable  - Lung cancer screening: n/a  Hep C Screening: UTD STD testing and prevention (HIV/chl/gon/syphilis): no concerns Sexual History: Incontinence Symptoms:   PATIENT COUNSELING:    Advanced Care Planning: A voluntary discussion about advance care planning including the explanation and discussion of advance directives.  Discussed health care proxy and Living will, and the patient was able to identify a health care proxy as wife, Leanord Thibeau.  Patient does not have a living will at present time. If patient does have living will, I have requested they bring this to the clinic to be scanned in to their chart.  Sexuality: Discussed sexually transmitted diseases, partner selection, use of condoms, avoidance of unintended pregnancy  and contraceptive alternatives.   Advised to avoid cigarette smoking.  I discussed with the patient that most people either abstain from alcohol or drink within safe limits (<=14/week and <=4 drinks/occasion for males, <=7/weeks and <= 3 drinks/occasion for females) and that the risk for alcohol disorders and other health effects rises proportionally with the number of drinks per week and how often a drinker exceeds daily limits.  Discussed cessation/primary prevention of drug use and  availability of treatment for abuse.   Diet: Encouraged to adjust caloric intake to maintain  or achieve ideal body weight, to reduce intake of dietary saturated fat and total fat, to limit sodium intake by avoiding high sodium foods and not adding table salt, and to maintain adequate dietary potassium and calcium preferably from fresh fruits, vegetables, and low-fat dairy products.    Stressed the importance of regular exercise.  Injury prevention: Discussed safety belts, safety helmets, smoke detector, smoking near bedding or upholstery.   Dental health: Discussed importance of regular tooth brushing, flossing, and dental visits.   Follow up plan: NEXT PREVENTATIVE PHYSICAL DUE IN 1 YEAR. Return in about 1 year (around 10/25/2022).

## 2021-10-25 NOTE — Assessment & Plan Note (Signed)
Doing well on current regimen, no changes made today. 

## 2021-10-25 NOTE — Assessment & Plan Note (Signed)
Tolerant of PPI, continue. Continue to follow with GI.

## 2021-10-25 NOTE — Patient Instructions (Signed)
Preventive Care 21-34 Years Old, Male ?Preventive care refers to lifestyle choices and visits with your health care provider that can promote health and wellness. Preventive care visits are also called wellness exams. ?What can I expect for my preventive care visit? ?Counseling ?During your preventive care visit, your health care provider may ask about your: ?Medical history, including: ?Past medical problems. ?Family medical history. ?Current health, including: ?Emotional well-being. ?Home life and relationship well-being. ?Sexual activity. ?Lifestyle, including: ?Alcohol, nicotine or tobacco, and drug use. ?Access to firearms. ?Diet, exercise, and sleep habits. ?Safety issues such as seatbelt and bike helmet use. ?Sunscreen use. ?Work and work environment. ?Physical exam ?Your health care provider may check your: ?Height and weight. These may be used to calculate your BMI (body mass index). BMI is a measurement that tells if you are at a healthy weight. ?Waist circumference. This measures the distance around your waistline. This measurement also tells if you are at a healthy weight and may help predict your risk of certain diseases, such as type 2 diabetes and high blood pressure. ?Heart rate and blood pressure. ?Body temperature. ?Skin for abnormal spots. ?What immunizations do I need? ?Vaccines are usually given at various ages, according to a schedule. Your health care provider will recommend vaccines for you based on your age, medical history, and lifestyle or other factors, such as travel or where you work. ?What tests do I need? ?Screening ?Your health care provider may recommend screening tests for certain conditions. This may include: ?Lipid and cholesterol levels. ?Diabetes screening. This is done by checking your blood sugar (glucose) after you have not eaten for a while (fasting). ?Hepatitis B test. ?Hepatitis C test. ?HIV (human immunodeficiency virus) test. ?STI (sexually transmitted infection)  testing, if you are at risk. ?Talk with your health care provider about your test results, treatment options, and if necessary, the need for more tests. ?Follow these instructions at home: ?Eating and drinking ? ?Eat a healthy diet that includes fresh fruits and vegetables, whole grains, lean protein, and low-fat dairy products. ?Drink enough fluid to keep your urine pale yellow. ?Take vitamin and mineral supplements as recommended by your health care provider. ?Do not drink alcohol if your health care provider tells you not to drink. ?If you drink alcohol: ?Limit how much you have to 0-2 drinks a day. ?Know how much alcohol is in your drink. In the [place] one drink equals one 12 oz bottle of beer (355 mL), one 5 oz glass of wine (148 mL), or one 1? oz glass of hard liquor (44 mL). ?Lifestyle ?Brush your teeth every morning and night with fluoride toothpaste. Floss one time each day. ?Exercise for at least 30 minutes 5 or more days each week. ?Do not use any products that contain nicotine or tobacco. These products include cigarettes, chewing tobacco, and vaping devices, such as e-cigarettes. If you need help quitting, ask your health care provider. ?Do not use drugs. ?If you are sexually active, practice safe sex. Use a condom or other form of protection to prevent STIs. ?Find healthy ways to manage stress, such as: ?Meditation, yoga, or listening to music. ?Journaling. ?Talking to a trusted person. ?Spending time with friends and family. ?Minimize exposure to UV radiation to reduce your risk of skin cancer. ?Safety ?Always wear your seat belt while driving or riding in a vehicle. ?Do not drive: ?If you have been drinking alcohol. Do not ride with someone who has been drinking. ?If you have been using any mind-altering substances or   drugs. ?While texting. ?When you are tired or distracted. ?Wear a helmet and other protective equipment during sports activities. ?If you have firearms in your house, make sure you  follow all gun safety procedures. ?Seek help if you have been physically or sexually abused. ?What's next? ?Go to your health care provider once a year for an annual wellness visit. ?Ask your health care provider how often you should have your eyes and teeth checked. ?Stay up to date on all vaccines. ?This information is not intended to replace advice given to you by your health care provider. Make sure you discuss any questions you have with your health care provider. ?Document Revised: 03/31/2021 Document Reviewed: 03/31/2021 ?Elsevier Patient Education ? 2022 [employer] ? ?

## 2021-10-26 LAB — CBC WITH DIFFERENTIAL/PLATELET
Basophils Absolute: 0 10*3/uL (ref 0.0–0.2)
Basos: 0 %
EOS (ABSOLUTE): 0 10*3/uL (ref 0.0–0.4)
Eos: 0 %
Hematocrit: 46.4 % (ref 37.5–51.0)
Hemoglobin: 15.8 g/dL (ref 13.0–17.7)
Immature Grans (Abs): 0 10*3/uL (ref 0.0–0.1)
Immature Granulocytes: 0 %
Lymphocytes Absolute: 2 10*3/uL (ref 0.7–3.1)
Lymphs: 31 %
MCH: 30.7 pg (ref 26.6–33.0)
MCHC: 34.1 g/dL (ref 31.5–35.7)
MCV: 90 fL (ref 79–97)
Monocytes Absolute: 0.5 10*3/uL (ref 0.1–0.9)
Monocytes: 8 %
Neutrophils Absolute: 3.8 10*3/uL (ref 1.4–7.0)
Neutrophils: 61 %
Platelets: 243 10*3/uL (ref 150–450)
RBC: 5.15 x10E6/uL (ref 4.14–5.80)
RDW: 12.4 % (ref 11.6–15.4)
WBC: 6.4 10*3/uL (ref 3.4–10.8)

## 2021-10-26 LAB — COMPREHENSIVE METABOLIC PANEL
ALT: 30 IU/L (ref 0–44)
AST: 22 IU/L (ref 0–40)
Albumin/Globulin Ratio: 2 (ref 1.2–2.2)
Albumin: 4.9 g/dL (ref 4.0–5.0)
Alkaline Phosphatase: 75 IU/L (ref 44–121)
BUN/Creatinine Ratio: 11 (ref 9–20)
BUN: 12 mg/dL (ref 6–20)
Bilirubin Total: 0.4 mg/dL (ref 0.0–1.2)
CO2: 25 mmol/L (ref 20–29)
Calcium: 9.5 mg/dL (ref 8.7–10.2)
Chloride: 103 mmol/L (ref 96–106)
Creatinine, Ser: 1.09 mg/dL (ref 0.76–1.27)
Globulin, Total: 2.4 g/dL (ref 1.5–4.5)
Glucose: 91 mg/dL (ref 70–99)
Potassium: 4.2 mmol/L (ref 3.5–5.2)
Sodium: 140 mmol/L (ref 134–144)
Total Protein: 7.3 g/dL (ref 6.0–8.5)
eGFR: 92 mL/min/{1.73_m2} (ref >59)

## 2021-10-26 LAB — LIPID PANEL
Chol/HDL Ratio: 3.3 ratio (ref 0.0–5.0)
Cholesterol, Total: 183 mg/dL (ref 100–199)
HDL: 56 mg/dL (ref >39)
LDL Chol Calc (NIH): 110 mg/dL — ABNORMAL HIGH (ref 0–99)
Triglycerides: 91 mg/dL (ref 0–149)
VLDL Cholesterol Cal: 17 mg/dL (ref 5–40)

## 2021-10-26 LAB — HEMOGLOBIN A1C
Est. average glucose Bld gHb Est-mCnc: 114 mg/dL
Hgb A1c MFr Bld: 5.6 % (ref 4.8–5.6)

## 2021-12-20 DIAGNOSIS — H1013 Acute atopic conjunctivitis, bilateral: Secondary | ICD-10-CM | POA: Diagnosis not present

## 2022-05-12 ENCOUNTER — Ambulatory Visit (INDEPENDENT_AMBULATORY_CARE_PROVIDER_SITE_OTHER): Payer: BC Managed Care – PPO | Admitting: Family Medicine

## 2022-05-12 VITALS — BP 119/80 | HR 70 | Temp 98.4°F | Wt 204.0 lb

## 2022-05-12 DIAGNOSIS — M25511 Pain in right shoulder: Secondary | ICD-10-CM | POA: Diagnosis not present

## 2022-05-12 NOTE — Progress Notes (Signed)
    Vivien Rota DeSanto,acting as a scribe for Megan Mans, MD.,have documented all relevant documentation on the behalf of Megan Mans, MD,as directed by  Megan Mans, MD while in the presence of Megan Mans, MD.    Established patient visit   Patient: Cody Marshall   DOB: 03-03-1986   36 y.o. Male  MRN: [id number] Visit Date: 05/12/2022  Today's healthcare provider: Megan Mans, MD   No chief complaint on file.  Subjective    HPI  Patient is a 36 year old male who presents with complaint of shoulder pain for 1 year.   It is his right shoulder and he states that in Oct 2022 he was seen at [facility].  He reports they did x-rays and said the only thing they found was some inflammation.  He has only been using OTC Ibuprofen for pain.  He states the shoulder has not gotten any better since being seen by them.  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  It hurts to abduct his right shoulder.  This pain is persisted despite ibuprofen but ibuprofen does help some.  Medications: Outpatient Medications Prior to Visit  Medication Sig   omeprazole (PRILOSEC) 20 MG capsule Take 1 capsule (20 mg total) by mouth 2 (two) times daily.   No facility-administered medications prior to visit.    Review of Systems  Musculoskeletal:  Positive for arthralgias. Negative for back pain, myalgias, neck pain and neck stiffness.        Objective    BP 119/80 (BP Location: Left Arm, Patient Position: Sitting, Cuff Size: Normal)   Pulse 70   Temp 98.4 F (36.9 C) (Oral)   Wt 204 lb (92.5 kg)   SpO2 98%   BMI 28.45 kg/m  BP Readings from Last 3 Encounters:  05/12/22 119/80  10/25/21 123/81  10/25/20 (!) 143/84   Wt Readings from Last 3 Encounters:  05/12/22 204 lb (92.5 kg)  10/25/21 199 lb (90.3 kg)  10/25/20 210 lb (95.3 kg)      Physical Exam Vitals reviewed.  Constitutional:      Appearance: Normal  appearance.  Pulmonary:     Effort: Pulmonary effort is normal.  Musculoskeletal:        General: Tenderness present. No swelling.     Comments: Mild tenderness anteriorly and over the distal clavicle on the right shoulder He has some pain with abduction of the shoulder.  Neurological:     Mental Status: He is alert.       No results found for any visits on 05/12/22.  Assessment & Plan     1. Right shoulder pain, unspecified chronicity Continue ibuprofen for now will refer to orthopedics for evaluation for shoulder arthropathy. - Ambulatory referral to Orthopedic Surgery   No follow-ups on file.      I, Megan Mans, MD, have reviewed all documentation for this visit. The documentation on 05/12/22 for the exam, diagnosis, procedures, and orders are all accurate and complete.    Jhania Etherington Wendelyn Breslow, MD  [facility] [phone] (phone) [phone] (fax)  [facility]

## 2022-05-20 DIAGNOSIS — M25511 Pain in right shoulder: Secondary | ICD-10-CM | POA: Diagnosis not present

## 2022-05-20 DIAGNOSIS — M7541 Impingement syndrome of right shoulder: Secondary | ICD-10-CM | POA: Diagnosis not present

## 2022-06-03 DIAGNOSIS — M7541 Impingement syndrome of right shoulder: Secondary | ICD-10-CM | POA: Diagnosis not present

## 2022-06-15 DIAGNOSIS — M7541 Impingement syndrome of right shoulder: Secondary | ICD-10-CM | POA: Diagnosis not present

## 2022-08-10 DIAGNOSIS — M25511 Pain in right shoulder: Secondary | ICD-10-CM | POA: Diagnosis not present

## 2022-08-10 DIAGNOSIS — M7541 Impingement syndrome of right shoulder: Secondary | ICD-10-CM | POA: Diagnosis not present

## 2022-08-15 DIAGNOSIS — M7541 Impingement syndrome of right shoulder: Secondary | ICD-10-CM | POA: Diagnosis not present

## 2022-08-15 DIAGNOSIS — M13811 Other specified arthritis, right shoulder: Secondary | ICD-10-CM | POA: Diagnosis not present

## 2022-10-26 ENCOUNTER — Ambulatory Visit (INDEPENDENT_AMBULATORY_CARE_PROVIDER_SITE_OTHER): Payer: BC Managed Care – PPO | Admitting: Family Medicine

## 2022-10-26 ENCOUNTER — Encounter: Payer: Self-pay | Admitting: Family Medicine

## 2022-10-26 VITALS — BP 126/76 | HR 64 | Temp 98.2°F | Resp 16 | Ht 71.0 in | Wt 212.7 lb

## 2022-10-26 DIAGNOSIS — M7541 Impingement syndrome of right shoulder: Secondary | ICD-10-CM

## 2022-10-26 DIAGNOSIS — Z6829 Body mass index (BMI) 29.0-29.9, adult: Secondary | ICD-10-CM | POA: Diagnosis not present

## 2022-10-26 DIAGNOSIS — K219 Gastro-esophageal reflux disease without esophagitis: Secondary | ICD-10-CM | POA: Diagnosis not present

## 2022-10-26 DIAGNOSIS — Z791 Long term (current) use of non-steroidal anti-inflammatories (NSAID): Secondary | ICD-10-CM | POA: Diagnosis not present

## 2022-10-26 DIAGNOSIS — Z0000 Encounter for general adult medical examination without abnormal findings: Secondary | ICD-10-CM | POA: Diagnosis not present

## 2022-10-26 NOTE — Assessment & Plan Note (Signed)
Recommended COVID and influenza vaccines  Physical form completed, patient works as Airline pilot for [employer]

## 2022-10-26 NOTE — Progress Notes (Signed)
I,Joseline E Rosas,acting as a scribe for [employer], MD.,have documented all relevant documentation on the behalf of Eulis Foster, MD,as directed by  Eulis Foster, MD while in the presence of Eulis Foster, MD.   Complete physical exam   Patient: Cody Marshall   DOB: 1985-12-26   37 y.o. Male  MRN: [id number] Visit Date: 10/26/2022  Today's healthcare provider: Eulis Foster, MD   Chief Complaint  Patient presents with   Annual Exam   Subjective    Cody Marshall is a 37 y.o. male who presents today for a complete physical exam.    He reports consuming a general diet. The patient has a physically strenuous job, but has no regular exercise apart from work.  He generally feels well. He reports sleeping well. He does not have additional problems to discuss today.  HPI  Patient medical approval form fill out.  Right Shoulder Impingement  Patient reports shoulder pain is improved after injection for MRI  He takes meloxicam PRN  Follows with Dr. Mack Guise for right shoulder impingement-Meloxicam 15 mg and 7.5 mg  GERD  Takes prilosec as needed  Reports symptoms well controlled    Health Maintenance  Influenza Vaccine: Does not take  Covid Vaccine:Declined    Past Medical History:  Diagnosis Date   Acid reflux    Past Surgical History:  Procedure Laterality Date   ESOPHAGOGASTRODUODENOSCOPY (EGD) WITH PROPOFOL N/A 07/08/2020   Procedure: ESOPHAGOGASTRODUODENOSCOPY (EGD) WITH PROPOFOL;  Surgeon: Virgel Manifold, MD;  Location: [facility] ENDOSCOPY;  Service: Endoscopy;  Laterality: N/A;   NO PAST SURGERIES     Social History   Socioeconomic History   Marital status: Married    Spouse name: Not on file   Number of children: Not on file   Years of education: Not on file   Highest education level: Not on file  Occupational History   Not on file  Tobacco Use   Smoking status: Never   Smokeless tobacco:  Never  Substance and Sexual Activity   Alcohol use: Yes    Alcohol/week: 0.0 standard drinks of alcohol    Comment: 2 per week   Drug use: No   Sexual activity: Not on file  Other Topics Concern   Not on file  Social History Narrative   Not on file   Social Determinants of Health   Financial Resource Strain: Not on file  Food Insecurity: Not on file  Transportation Needs: Not on file  Physical Activity: Not on file  Stress: Not on file  Social Connections: Not on file  Intimate Partner Violence: Not on file   Family Status  Relation Name Status   Mother  Alive   Father  Other       unknown health status   Brother  Alive   MGF  (Not Specified)   Family History  Problem Relation Age of Onset   Heart disease Maternal Grandfather    No Known Allergies  Patient Care Team: Eulis Foster, MD as PCP - General (Family Medicine)   Medications: Outpatient Medications Prior to Visit  Medication Sig   meloxicam (MOBIC) 15 MG tablet 15 mg.   MOBIC 7.5 MG tablet 7.5 mg.   omeprazole (PRILOSEC) 20 MG capsule Take 1 capsule (20 mg total) by mouth 2 (two) times daily.   No facility-administered medications prior to visit.    Review of Systems  All other systems reviewed and are negative.     Objective    BP  126/76 (BP Location: Left Arm, Patient Position: Sitting, Cuff Size: Large)   Pulse 64   Temp 98.2 F (36.8 C) (Oral)   Resp 16   Ht 5\' 11"  (1.803 m)   Wt 212 lb 11.2 oz (96.5 kg)   BMI 29.67 kg/m     Physical Exam Vitals reviewed.  Constitutional:      General: He is not in acute distress.    Appearance: Normal appearance. He is not ill-appearing, toxic-appearing or diaphoretic.  HENT:     Head: Normocephalic and atraumatic.     Right Ear: Tympanic membrane and external ear normal.     Left Ear: Tympanic membrane and external ear normal.     Nose: Nose normal.     Mouth/Throat:     Mouth: Mucous membranes are moist.     Pharynx: No  oropharyngeal exudate or posterior oropharyngeal erythema.  Eyes:     General: No scleral icterus.    Extraocular Movements: Extraocular movements intact.     Conjunctiva/sclera: Conjunctivae normal.     Pupils: Pupils are equal, round, and reactive to light.  Neck:     Vascular: No carotid bruit.  Cardiovascular:     Rate and Rhythm: Normal rate and regular rhythm.     Pulses: Normal pulses.     Heart sounds: Normal heart sounds. No murmur heard.    No friction rub. No gallop.  Pulmonary:     Effort: Pulmonary effort is normal. No respiratory distress.     Breath sounds: Normal breath sounds. No stridor. No wheezing, rhonchi or rales.  Abdominal:     General: Bowel sounds are normal. There is no distension.     Palpations: Abdomen is soft.     Tenderness: There is no abdominal tenderness.  Musculoskeletal:        General: No swelling, tenderness or signs of injury. Normal range of motion.     Cervical back: Normal range of motion and neck supple. No rigidity or tenderness.     Right lower leg: No edema.     Left lower leg: No edema.  Lymphadenopathy:     Cervical: No cervical adenopathy.  Skin:    General: Skin is warm and dry.     Capillary Refill: Capillary refill takes less than 2 seconds.     Findings: No erythema or rash.  Neurological:     Mental Status: He is alert and oriented to person, place, and time.     Motor: No weakness.     Gait: Gait normal.  Psychiatric:        Attention and Perception: Attention normal.        Mood and Affect: Mood normal.        Speech: Speech normal.        Behavior: Behavior normal. Behavior is cooperative.        Thought Content: Thought content normal.       Last depression screening scores    10/26/2022    2:06 PM 10/25/2021    1:45 PM 09/23/2020    3:11 PM  PHQ 2/9 Scores  PHQ - 2 Score 0 0 0  PHQ- 9 Score 0 0 0   Last fall risk screening    10/26/2022    2:06 PM  Fall Risk   Falls in the past year? 0  Number falls in  past yr: 0  Injury with Fall? 0  Risk for fall due to : No Fall Risks   Last Audit-C alcohol use  screening    10/25/2021    1:45 PM  Alcohol Use Disorder Test (AUDIT)  1. How often do you have a drink containing alcohol? 2  2. How many drinks containing alcohol do you have on a typical day when you are drinking? 1  3. How often do you have six or more drinks on one occasion? 1  AUDIT-C Score 4  4. How often during the last year have you found that you were not able to stop drinking once you had started? 0  5. How often during the last year have you failed to do what was normally expected from you because of drinking? 0  6. How often during the last year have you needed a first drink in the morning to get yourself going after a heavy drinking session? 0  7. How often during the last year have you had a feeling of guilt of remorse after drinking? 0  8. How often during the last year have you been unable to remember what happened the night before because you had been drinking? 0  9. Have you or someone else been injured as a result of your drinking? 0  10. Has a relative or friend or a doctor or another health worker been concerned about your drinking or suggested you cut down? 0  Alcohol Use Disorder Identification Test Final Score (AUDIT) 4   A score of 3 or more in women, and 4 or more in men indicates increased risk for alcohol abuse, EXCEPT if all of the points are from question 1   No results found for any visits on 10/26/22.  Assessment & Plan    Routine Health Maintenance and Physical Exam  Exercise Activities and Dietary recommendations  Goals   None     Immunization History  Administered Date(s) Administered   DTaP 01/14/1988, 03/16/1988, 05/13/1988, 02/10/1989, 12/09/1992   HIB (PRP-OMP) 05/10/1989   Hepatitis B 08/02/1999, 09/27/1999, 02/14/2000   IPV 01/14/1988, 03/16/1988, 02/10/1989, 12/09/1992   MMR 02/10/1989, 12/09/1992   Tdap 12/31/2014    Health Maintenance   Topic Date Due   COVID-19 Vaccine (1) 11/11/2022 (Originally 05/10/1988)   INFLUENZA VACCINE  01/15/2023 (Originally 05/17/2022)   DTaP/Tdap/Td (7 - Td or Tdap) 12/30/2024   Hepatitis C Screening  Completed   HIV Screening  Completed   HPV VACCINES  Aged Out    Discussed health benefits of physical activity, and encouraged him to engage in regular exercise appropriate for his age and condition.  Problem List Items Addressed This Visit       Digestive   Acid reflux    Asymptomatic today  PRN use of omeprazole Continue current regimen         Musculoskeletal and Integument   Impingement syndrome of right shoulder region    Asymptomatic today  Followed by orthopedics  Intermittent use of meloxicam  Symptoms controlled with this regimen  CMP and CBC ordered today       Relevant Medications   MOBIC 7.5 MG tablet   meloxicam (MOBIC) 15 MG tablet     Other   Annual physical exam - Primary    Recommended COVID and influenza vaccines  Physical form completed, patient works as Airline pilot for [employer]       Relevant Orders   Comprehensive metabolic panel   CBC   Direct LDL   BMI 29.0-29.9,adult   Relevant Orders   Comprehensive metabolic panel   CBC   Direct LDL   Encounter for long-term (current) use  of NSAIDs    Intermittent use of meloxicam for shoulder impingement  Followed by orthopedics  No current symptoms  Will check CMP and CBC today       Relevant Orders   Comprehensive metabolic panel   CBC     Return in about 1 year (around 10/27/2023) for CPE.     I, Ronnald Ramp, MD, have reviewed all documentation for this visit.  Portions of this information were initially documented by the CMA and reviewed by me for thoroughness and accuracy.      Ronnald Ramp, MD  [facility] [phone] (phone) [phone] (fax)  [facility]

## 2022-10-26 NOTE — Assessment & Plan Note (Signed)
Asymptomatic today  PRN use of omeprazole Continue current regimen

## 2022-10-26 NOTE — Assessment & Plan Note (Signed)
Intermittent use of meloxicam for shoulder impingement  Followed by orthopedics  No current symptoms  Will check CMP and CBC today

## 2022-10-26 NOTE — Assessment & Plan Note (Signed)
Asymptomatic today  Followed by orthopedics  Intermittent use of meloxicam  Symptoms controlled with this regimen  CMP and CBC ordered today

## 2022-10-27 LAB — CBC
Hematocrit: 44.4 % (ref 37.5–51.0)
Hemoglobin: 14.9 g/dL (ref 13.0–17.7)
MCH: 30.3 pg (ref 26.6–33.0)
MCHC: 33.6 g/dL (ref 31.5–35.7)
MCV: 90 fL (ref 79–97)
Platelets: 238 10*3/uL (ref 150–450)
RBC: 4.91 x10E6/uL (ref 4.14–5.80)
RDW: 12.6 % (ref 11.6–15.4)
WBC: 5.5 10*3/uL (ref 3.4–10.8)

## 2022-10-27 LAB — COMPREHENSIVE METABOLIC PANEL
ALT: 30 IU/L (ref 0–44)
AST: 20 IU/L (ref 0–40)
Albumin/Globulin Ratio: 1.7 (ref 1.2–2.2)
Albumin: 4.5 g/dL (ref 4.1–5.1)
Alkaline Phosphatase: 73 IU/L (ref 44–121)
BUN/Creatinine Ratio: 12 (ref 9–20)
BUN: 13 mg/dL (ref 6–20)
Bilirubin Total: 0.4 mg/dL (ref 0.0–1.2)
CO2: 23 mmol/L (ref 20–29)
Calcium: 9.4 mg/dL (ref 8.7–10.2)
Chloride: 102 mmol/L (ref 96–106)
Creatinine, Ser: 1.09 mg/dL (ref 0.76–1.27)
Globulin, Total: 2.6 g/dL (ref 1.5–4.5)
Glucose: 86 mg/dL (ref 70–99)
Potassium: 4 mmol/L (ref 3.5–5.2)
Sodium: 139 mmol/L (ref 134–144)
Total Protein: 7.1 g/dL (ref 6.0–8.5)
eGFR: 91 mL/min/{1.73_m2} (ref >59)

## 2022-10-27 LAB — LDL CHOLESTEROL, DIRECT: LDL Direct: 111 mg/dL — ABNORMAL HIGH (ref 0–99)

## 2023-01-09 DIAGNOSIS — M7541 Impingement syndrome of right shoulder: Secondary | ICD-10-CM | POA: Diagnosis not present

## 2023-01-20 ENCOUNTER — Ambulatory Visit (INDEPENDENT_AMBULATORY_CARE_PROVIDER_SITE_OTHER): Payer: BC Managed Care – PPO

## 2023-01-20 ENCOUNTER — Ambulatory Visit
Admission: RE | Admit: 2023-01-20 | Discharge: 2023-01-20 | Disposition: A | Discharge status: Home / Self Care | Payer: BC Managed Care – PPO | Source: Ambulatory Visit | Attending: Emergency Medicine | Admitting: Emergency Medicine

## 2023-01-20 VITALS — BP 132/85 | HR 85 | Temp 97.9°F | Resp 18

## 2023-01-20 DIAGNOSIS — M79671 Pain in right foot: Secondary | ICD-10-CM | POA: Insufficient documentation

## 2023-01-20 DIAGNOSIS — M7731 Calcaneal spur, right foot: Secondary | ICD-10-CM | POA: Diagnosis not present

## 2023-01-20 NOTE — ED Triage Notes (Signed)
Patient to Urgent Care with complaints of right sided foot pain that started one week ago. Denies any known injury.  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 Pain present to the lateral aspect to his foot. Pain only present when walking.

## 2023-01-20 NOTE — Discharge Instructions (Addendum)
Take ibuprofen as directed.  Rest and elevate your foot as needed  Apply ice packs 2-3 times a day for up to 20 minutes each.  Wear supportive shoes for comfort.     Follow up with an orthopedist.

## 2023-01-20 NOTE — ED Provider Notes (Signed)
[facility]    CSN: [id number] Arrival date & time: 01/20/23  0803      History   Chief Complaint Chief Complaint  Patient presents with   Foot Pain    Entered by patient    HPI Cody Marshall is a 37 y.o. male.  Patient presents with right lateral foot pain x 1 week.  No falls or injury.  Pain occurs with ambulation and weight-bearing.  No wounds, redness, bruising, swelling, fever, or other symptoms.  No treatment attempted.  No history of problem with right foot.    The history is provided by the patient and medical records.    Past Medical History:  Diagnosis Date   Acid reflux     Patient Active Problem List   Diagnosis Date Noted   Right foot pain 01/20/2023   Annual physical exam 10/26/2022   BMI 29.0-29.9,adult 10/26/2022   Encounter for long-term (current) use of NSAIDs 10/26/2022   Impingement syndrome of right shoulder region 07/29/2021   Intestinal metaplasia of gastric mucosa    Esophageal candidiasis    Chest pain, unspecified 06/03/2016   Acid reflux 10/22/2015    Past Surgical History:  Procedure Laterality Date   ESOPHAGOGASTRODUODENOSCOPY (EGD) WITH PROPOFOL N/A 07/08/2020   Procedure: ESOPHAGOGASTRODUODENOSCOPY (EGD) WITH PROPOFOL;  Surgeon: Pasty Spillersahiliani, Varnita B, MD;  Location: [facility] ENDOSCOPY;  Service: Endoscopy;  Laterality: N/A;   NO PAST SURGERIES         Home Medications    Prior to Admission medications   Medication Sig Start Date End Date Taking? Authorizing Provider  meloxicam (MOBIC) 15 MG tablet 15 mg. 07/29/21   [provider]  MOBIC 7.5 MG tablet 7.5 mg. 08/15/22   [provider]  omeprazole (PRILOSEC) 20 MG capsule Take 1 capsule (20 mg total) by mouth 2 (two) times daily. 04/23/20   Bosie ClosGilbert, Richard L, MD    Family History Family History  Problem Relation Age of Onset   Heart disease Maternal Grandfather     Social History Social History   Tobacco Use   Smoking status: Never   Smokeless  tobacco: Never  Substance Use Topics   Alcohol use: Yes    Alcohol/week: 0.0 standard drinks of alcohol    Comment: 2 per week   Drug use: No     Allergies   Patient has no known allergies.   Review of Systems Review of Systems  Constitutional:  Negative for chills and fever.  Musculoskeletal:  Positive for arthralgias and gait problem. Negative for joint swelling and myalgias.  Skin:  Negative for color change, rash and wound.  Neurological:  Negative for weakness and numbness.  All other systems reviewed and are negative.    Physical Exam Triage Vital Signs ED Triage Vitals  Enc Vitals Group     BP      Pulse      Resp      Temp      Temp src      SpO2      Weight      Height      Head Circumference      Peak Flow      Pain Score      Pain Loc      Pain Edu?      Excl. in GC?    No data found.  Updated Vital Signs BP 132/85   Pulse 85   Temp 97.9 F (36.6 C)   Resp 18   SpO2  96%   Visual Acuity Right Eye Distance:   Left Eye Distance:   Bilateral Distance:    Right Eye Near:   Left Eye Near:    Bilateral Near:     Physical Exam Vitals and nursing note reviewed.  Constitutional:      General: He is not in acute distress.    Appearance: Normal appearance. He is well-developed. He is not ill-appearing.  HENT:     Mouth/Throat:     Mouth: Mucous membranes are moist.  Cardiovascular:     Rate and Rhythm: Normal rate and regular rhythm.     Heart sounds: Normal heart sounds.  Pulmonary:     Effort: Pulmonary effort is normal. No respiratory distress.     Breath sounds: Normal breath sounds.  Musculoskeletal:        General: Tenderness present. No swelling, deformity or signs of injury. Normal range of motion.     Cervical back: Neck supple.       Feet:  Feet:     Comments: Right foot and ankle: FROM, sensation intact, strength 5/5.  2+ pedal pulse.  No wounds, erythema, ecchymosis, edema.  Skin:    General: Skin is warm and dry.      Capillary Refill: Capillary refill takes less than 2 seconds.     Findings: No bruising, erythema, lesion or rash.  Neurological:     General: No focal deficit present.     Mental Status: He is alert and oriented to person, place, and time.     Sensory: No sensory deficit.     Motor: No weakness.     Gait: Gait normal.  Psychiatric:        Mood and Affect: Mood normal.        Behavior: Behavior normal.      UC Treatments / Results  Labs (all labs ordered are listed, but only abnormal results are displayed) Labs Reviewed - No data to display  EKG   Radiology DG Foot Complete Right  Result Date: 01/20/2023 CLINICAL DATA:  pain when walking EXAM: RIGHT FOOT COMPLETE - 3+ VIEW COMPARISON:  02/19/2005 FINDINGS: There is no evidence of fracture or dislocation. There is no evidence of arthropathy or other focal bone abnormality. Small calcaneal spur at the insertion of the plantar aponeurosis. Soft tissues are unremarkable. IMPRESSION: Small calcaneal spur, otherwise negative. Electronically Signed   By: Corlis Leak M.D.   On: 01/20/2023 08:37    Procedures Procedures (including critical care time)  Medications Ordered in UC Medications - No data to display  Initial Impression / Assessment and Plan / UC Course  I have reviewed the triage vital signs and the nursing notes.  Pertinent labs & imaging results that were available during my care of the patient were reviewed by me and considered in my medical decision making (see chart for details).    Right foot pain, calcaneal spur.  Afebrile, VSS.  Xray shows calcaneal spur.  Treating with rest, elevation, ice packs, OTC ibuprofen, supportive shoes.  Education provided on foot pain and bone spur.  Instructed patient to follow-up with orthopedics.  Patient has previously seen [facility] and will follow up with them.  Patient agrees to plan of care.   Final Clinical Impressions(s) / UC Diagnoses   Final diagnoses:  Right foot pain   Calcaneal spur of right foot     Discharge Instructions      Take ibuprofen as directed.  Rest and elevate your foot as needed  Apply ice packs 2-3 times a day for up to 20 minutes each.  Wear supportive shoes for comfort.     Follow up with an orthopedist.        ED Prescriptions   None    I have reviewed the PDMP during this encounter.   Mickie Bailate, Keiton Cosma H, NP 01/20/23 [phone]

## 2023-02-08 ENCOUNTER — Ambulatory Visit: Payer: BC Managed Care – PPO | Admitting: Podiatrist

## 2023-02-08 ENCOUNTER — Ambulatory Visit: Payer: BC Managed Care – PPO | Admitting: Podiatry

## 2023-02-08 ENCOUNTER — Encounter: Payer: Self-pay | Admitting: Podiatrist

## 2023-02-08 DIAGNOSIS — M722 Plantar fascial fibromatosis: Secondary | ICD-10-CM

## 2023-02-08 NOTE — Patient Instructions (Signed)

## 2023-02-08 NOTE — Progress Notes (Signed)
  Chief Complaint  Patient presents with   Foot Pain    Lateral heel/ankle right - aching x few months, went to Urgent Care - xrayed, said had bone spur, tried ice and went back to wearing good, supportive shoes, "its actually a lot better now"   New Patient (Initial Visit)     HPI: Patient is 36 y.o. male who presents today for follow up from an urgent care visit for plantar fasciitis/ pain right foot. Cody Marshall relates he was wearing some "hey dudes" shoes and this is when his pain started.  He has since changed to [employer] running shoes and relates his pain has improved significantly.  He has had a bout of plantar fasciitis in the past and relates he has done stretches and taken ibuprofen with success. He is here at the recommendation of urgent care for follow up.    Patient Active Problem List   Diagnosis Date Noted   Right foot pain 01/20/2023   Annual physical exam 10/26/2022   BMI 29.0-29.9,adult 10/26/2022   Encounter for long-term (current) use of NSAIDs 10/26/2022   Impingement syndrome of right shoulder region 07/29/2021   Intestinal metaplasia of gastric mucosa    Esophageal candidiasis    Acid reflux 10/22/2015    No current outpatient medications on file prior to visit.   No current facility-administered medications on file prior to visit.    No Known Allergies  Review of Systems No fevers, chills, nausea, muscle aches, no difficulty breathing, no calf pain, no chest pain or shortness of breath.   Physical Exam  GENERAL APPEARANCE: Alert, conversant. Appropriately groomed. No acute distress.   VASCULAR: Pedal pulses palpable 2/4 DP and PT bilateral.  Capillary refill time is immediate to all digits,  Proximal to distal cooling is warm to warm.  Digital perfusion adequate.   NEUROLOGIC: sensation is intact to 5.07 monofilament at 5/5 sites bilateral.  Light touch is intact bilateral, vibratory sensation intact bilateral  MUSCULOSKELETAL: acceptable muscle strength,  tone and stability bilateral.  No gross boney pedal deformities noted.  No pain with palpation of the plantar fascia at hits insertion noted.  Mild tenderness along the course of plantar fascia medially right foot.   DERMATOLOGIC: skin is warm, supple, and dry.  Color, texture, and turgor of skin within normal limits.  No open wounds are noted.  No preulcerative lesions are seen.  Digital nails are asymptomatic.    Xrays reviewed and show a small inferior calcaneal bone spur-  otherwise normal right foot xray noted.    Assessment     ICD-10-CM   1. Plantar fasciitis, right  M72.2        Plan  Discussed xrays and physical exam.  He has improved with shoegear changes and conservative treatments.  I recommended he continue this and will call if symptoms fail to resolve.  Discussed a steroid injection is an option in the future if needed- at todays visit the pain does not necessitate an injection.  Stretches were dispensed and he will call if needed.

## 2023-04-27 DIAGNOSIS — Z3009 Encounter for other general counseling and advice on contraception: Secondary | ICD-10-CM | POA: Diagnosis not present

## 2023-04-27 DIAGNOSIS — K219 Gastro-esophageal reflux disease without esophagitis: Secondary | ICD-10-CM | POA: Diagnosis not present

## 2023-05-24 ENCOUNTER — Ambulatory Visit: Payer: BC Managed Care – PPO | Admitting: Urology

## 2023-05-24 VITALS — BP 130/83 | HR 54 | Ht 71.0 in | Wt 206.5 lb

## 2023-05-24 DIAGNOSIS — Z3009 Encounter for other general counseling and advice on contraception: Secondary | ICD-10-CM

## 2023-05-24 NOTE — Progress Notes (Signed)
I,Dina M Abdulla,acting as a scribe for Vanna Scotland, MD.,have documented all relevant documentation on the behalf of Vanna Scotland, MD,as directed by  Vanna Scotland, MD while in the presence of Vanna Scotland, MD.   05/24/2023 7:18 PM   Nolon Rod [birth year] [id number]  Referring provider: Bosie Clos, MD [address] [place],  [place] [address]  Chief Complaint  Patient presents with   Establish Care   VAS Consult    HPI: 37 y.o. year old male presents for vasectomy consultation with his wife today.  He denies a history of testicular trauma or pain.  No urinary issues.  No previous scrotal surgeries. Does not have biological children.   PMH: Past Medical History:  Diagnosis Date   Acid reflux     Surgical History: Past Surgical History:  Procedure Laterality Date   ESOPHAGOGASTRODUODENOSCOPY (EGD) WITH PROPOFOL N/A 07/08/2020   Procedure: ESOPHAGOGASTRODUODENOSCOPY (EGD) WITH PROPOFOL;  Surgeon: Pasty Spillers, MD;  Location: [facility] ENDOSCOPY;  Service: Endoscopy;  Laterality: N/A;   NO PAST SURGERIES      Home Medications:  Allergies as of 05/24/2023   No Known Allergies      Medication List    as of May 24, 2023  7:18 PM   You have not been prescribed any medications.      Family History: Family History  Problem Relation Age of Onset   Heart disease Maternal Grandfather     Social History:  reports that he has never smoked. He has never used smokeless tobacco. He reports current alcohol use. He reports that he does not use drugs.   Physical Exam: BP 130/83   Pulse (!) 54   Ht 5\' 11"  (1.803 m)   Wt 206 lb 8 oz (93.7 kg)   BMI 28.80 kg/m   Constitutional:  Alert and oriented, No acute distress. HEENT: [place] AT, moist mucus membranes.  Trachea midline, no masses. Neurologic: Grossly intact, no focal deficits, moving all 4 extremities. Psychiatric: Normal mood and affect.   Assessment & Plan:    1. Vasectomy  evaluation Today, we discussed what the vas deferens is, where it is located, and its function. We reviewed the procedure for vasectomy, it's risks, benefits, alternatives, and likelihood of achieving his goals. We discussed in detail the procedure, complications, and recovery as well as the need for clearance prior to unprotected intercourse. We discussed that vasectomy does not protect against sexually transmitted diseases. We discussed that this procedure does not result in immediate sterility and that they would need to use other forms of birth control until he has been cleared with negative postvasectomy semen analyses. I explained that the procedure is considered to be permanent and that attempts at reversal have varying degrees of success. These options include vasectomy reversal, sperm retrieval, and in vitro fertilization; these can be very expensive. We discussed the chance of postvasectomy pain syndrome which occurs in less than 5% of patients. I explained to the patient that there is no treatment to resolve this chronic pain, and that if it developed I would not be able to help resolve the issue, but that surgery is generally not needed for correction. I explained there have even been reports of systemic like illness associated with this chronic pain, and that there was no good cure. I explained that vasectomy it is not a 100% reliable form of birth control, and the risk of pregnancy after vasectomy is approximately 1 in 2000 men who had a negative  postvasectomy semen analysis or rare non-motile sperm. I explained that repeat vasectomy was necessary in less than 1% of vasectomy procedures when employing the type of technique that I use. I explained that he should refrain from ejaculation for approximately one week following vasectomy. I explained that there are other options for birth control which are permanent and non-permanent; we discussed these. I explained the rates of surgical complications, such  as symptomatic hematoma or infection, are low (1-2%) and vary with the surgeon's experience and criteria used to diagnose the complication.   The patient had the opportunity to ask questions to his stated satisfaction. He voiced understanding of the above factors and stated that he has read all the information provided to him and the packets and informed consent.  He is interested in receiving of Valium 10 mg prior to the procedure for the purpose of anxiolysis.  A prescription was given today.  He will have a driver on the day of the procedure.  He will schedule procedure based on his availability.   [facility] [address] [place], [place] [address] [phone]

## 2023-05-24 NOTE — Patient Instructions (Signed)

## 2023-05-25 MED ORDER — DIAZEPAM 10 MG PO TABS: 10.0000 mg | ORAL_TABLET | Freq: Once | ORAL | 0 refills | Status: AC

## 2023-07-25 ENCOUNTER — Ambulatory Visit: Payer: BC Managed Care – PPO | Admitting: Urology

## 2023-07-25 VITALS — BP 124/78 | HR 79

## 2023-07-25 DIAGNOSIS — Z302 Encounter for sterilization: Secondary | ICD-10-CM

## 2023-07-25 NOTE — Progress Notes (Signed)
07/25/23  CC:  Chief Complaint  Patient presents with   VAS    HPI: 37 yo M desiring vasectomy who presents today for this procedure.  Blood pressure 124/78, pulse 79. NED. A&Ox3.   No respiratory distress   Abd soft, NT, ND Normal external genitalia with patent urethral meatus  A timeout was performed.  Patient's identity and consent was confirmed.  All questions were answered.   Bilateral Vasectomy Procedure  Pre-Procedure: - Patient's scrotum was prepped and draped for vasectomy. - The vas was palpated through the scrotal skin on the left. - 1% Xylocaine was injected into the skin and surrounding tissue for placement  - In a similar manner, the vas on the right was identified, anesthetized, and stabilized.  Procedure: - A #11 blade was used to make a small stab incision in the skin overlying the vas - The left vas was isolated and brought up through the incision exposing that structure. - Bleeding points were cauterized as they occurred. - The vas was free from the surrounding structures and brought to the view. - A segment was positioned for placement with a hemostat. - A second hemostat was placed and a small segment between the two hemostats and was removed for inspection. - Each end of the transected vas lumen was fulgurated/ obliterated using needlepoint electrocautery -A fascial interposition was performed on testicular end of the vas using #3-0 chromic suture -The same procedure was performed on the right. - A single suture of #3-0 chromic catgut was used to close each lateral scrotal skin incision - A dressing was applied.  Post-Procedure: - Patient was instructed in care of the operative area - A specimen is to be delivered in 12 weeks   -Another form of contraception is to be used until post vasectomy semen analysis  Cody Scotland, MD

## 2023-07-25 NOTE — Patient Instructions (Signed)

## 2023-10-31 ENCOUNTER — Other Ambulatory Visit: Payer: BC Managed Care – PPO

## 2023-11-02 ENCOUNTER — Other Ambulatory Visit: Payer: BC Managed Care – PPO

## 2023-11-02 DIAGNOSIS — Z302 Encounter for sterilization: Secondary | ICD-10-CM

## 2023-11-03 LAB — POST-VAS SPERM EVALUATION,QUAL: Volume: 3 mL

## 2023-11-06 ENCOUNTER — Encounter: Payer: Self-pay | Admitting: Urology

## 2023-11-06 DIAGNOSIS — Z9852 Vasectomy status: Secondary | ICD-10-CM

## 2023-12-05 ENCOUNTER — Other Ambulatory Visit: Payer: BC Managed Care – PPO

## 2023-12-05 DIAGNOSIS — Z9852 Vasectomy status: Secondary | ICD-10-CM

## 2023-12-06 LAB — POST-VAS SPERM EVALUATION,QUAL: Volume: 2.6 mL

## 2023-12-07 ENCOUNTER — Encounter: Payer: Self-pay | Admitting: Urology
